# Patient Record
Sex: Male | Born: 1980 | Race: White | Hispanic: No | Marital: Single | Smoking: Current every day smoker
Health system: Southern US, Community
[De-identification: ages and names within clinical notes are randomized; demographics above are authoritative.]

## PROBLEM LIST (undated history)

## (undated) DIAGNOSIS — F1910 Other psychoactive substance abuse, uncomplicated: Secondary | ICD-10-CM

## (undated) DIAGNOSIS — B159 Hepatitis A without hepatic coma: Secondary | ICD-10-CM

## (undated) DIAGNOSIS — F419 Anxiety disorder, unspecified: Secondary | ICD-10-CM

## (undated) HISTORY — DX: Hepatitis a without hepatic coma: B15.9

## (undated) HISTORY — PX: WISDOM TOOTH EXTRACTION: SHX21

## (undated) HISTORY — DX: Other psychoactive substance abuse, uncomplicated: F19.10

## (undated) HISTORY — PX: VARICOCELECTOMY: SHX1084

---

## 2010-08-13 ENCOUNTER — Emergency Department (HOSPITAL_COMMUNITY)
Admission: EM | Admit: 2010-08-13 | Discharge: 2010-08-13 | Payer: Self-pay | Source: Home / Self Care | Admitting: Family Medicine

## 2010-08-19 LAB — GLUCOSE, CAPILLARY: Glucose-Capillary: 83 mg/dL (ref 70–99)

## 2017-09-30 ENCOUNTER — Emergency Department (HOSPITAL_BASED_OUTPATIENT_CLINIC_OR_DEPARTMENT_OTHER)
Admission: EM | Admit: 2017-09-30 | Discharge: 2017-09-30 | Disposition: A | Discharge status: Home / Self Care | Payer: No Typology Code available for payment source | Attending: Emergency Medicine | Admitting: Emergency Medicine

## 2017-09-30 ENCOUNTER — Encounter (HOSPITAL_BASED_OUTPATIENT_CLINIC_OR_DEPARTMENT_OTHER): Payer: Self-pay | Admitting: *Deleted

## 2017-09-30 ENCOUNTER — Other Ambulatory Visit: Payer: Self-pay

## 2017-09-30 DIAGNOSIS — F17210 Nicotine dependence, cigarettes, uncomplicated: Secondary | ICD-10-CM | POA: Insufficient documentation

## 2017-09-30 DIAGNOSIS — H9202 Otalgia, left ear: Secondary | ICD-10-CM | POA: Insufficient documentation

## 2017-09-30 HISTORY — DX: Anxiety disorder, unspecified: F41.9

## 2017-09-30 MED ORDER — AMOXICILLIN-POT CLAVULANATE 875-125 MG PO TABS: 1.0000 | ORAL_TABLET | Freq: Two times a day (BID) | ORAL | 0 refills | Status: DC

## 2017-09-30 MED ORDER — IBUPROFEN 800 MG PO TABS: 800.0000 mg | ORAL_TABLET | Freq: Three times a day (TID) | ORAL | 0 refills | Status: DC | PRN

## 2017-09-30 NOTE — ED Notes (Signed)
On the way out the door, pt stated "I ain't payin ya nothing.  Ya didn't do nothin'".

## 2017-09-30 NOTE — ED Provider Notes (Signed)
Emergency Department Provider Note   I have reviewed the triage vital signs and the nursing notes.   HISTORY  Chief Complaint Otalgia   HPI Patrick Lynn is a 37 y.o. male with PMH anxiety presents to the emergency department for evaluation of left ear pain with sensation of ear canal swelling.  Patient has been digging in the ear with various sharp objects at home and states he is been expressing some drainage.  He is having decreased hearing.  Symptoms have been present for the last 3 weeks.  He was on an outpatient antibiotic with no relief in symptoms.  Presented to urgent care today and was referred to the emergency department.  Denies fevers or shaking chills.  No headache or neck discomfort.  Denies vision changes.   Past Medical History:  Diagnosis Date  . Anxiety     There are no active problems to display for this patient.   History reviewed. No pertinent surgical history.  Current Outpatient Rx  . Order #: [id number] Class: Historical Med  . Order #: [id number] Class: Historical Med  . Order #: [id number] Class: Print  . Order #: [id number] Class: Print    Allergies Patient has no known allergies.  History reviewed. No pertinent family history.  Social History Social History   Tobacco Use  . Smoking status: Current Every Day Smoker    Types: Cigarettes  . Smokeless tobacco: Never Used  Substance Use Topics  . Alcohol use: No    Frequency: Never  . Drug use: No    Review of Systems  Constitutional: No fever/chills Eyes: No visual changes. ENT: No sore throat. Left ear pain and redness.  Cardiovascular: Denies chest pain. Respiratory: Denies shortness of breath. Gastrointestinal: No abdominal pain.  No nausea, no vomiting.  No diarrhea.  No constipation. Genitourinary: Negative for dysuria. Musculoskeletal: Negative for back pain. Skin: Negative for rash. Neurological: Negative for headaches, focal weakness or numbness.  10-point ROS otherwise  negative.  ____________________________________________   PHYSICAL EXAM:  VITAL SIGNS: ED Triage Vitals  Enc Vitals Group     BP 09/30/17 1745 (!) 132/92     Pulse Rate 09/30/17 1745 82     Resp 09/30/17 1745 12     Temp 09/30/17 1745 98.5 F (36.9 C)     Temp src --      SpO2 09/30/17 1745 100 %     Weight 09/30/17 1743 230 lb (104.3 kg)     Height 09/30/17 1743 6\' 4"  (1.93 m)     Pain Score 09/30/17 1743 5   Constitutional: Alert and oriented. Well appearing and in no acute distress. Eyes: Conjunctivae are normal.  Head: Atraumatic. Ears:  Mild narrowing of the left ear canal. Normal TMs bilaterally. Pinna on left is slightly erythematous. No discharge or abscess. No mastoid tenderness.  Nose: No congestion/rhinnorhea. Mouth/Throat: Mucous membranes are moist.  Neck: No stridor.   Cardiovascular: Good peripheral circulation.  Respiratory: Normal respiratory effort. Gastrointestinal: No distention.  Musculoskeletal: No lower extremity tenderness nor edema. No gross deformities of extremities. Neurologic:  Normal speech and language. No gross focal neurologic deficits are appreciated.  Skin:  Skin is warm, dry and intact. No rash noted.  ____________________________________________   PROCEDURES  Procedure(s) performed:   Procedures  None ____________________________________________   INITIAL IMPRESSION / ASSESSMENT AND PLAN / ED COURSE  Pertinent labs & imaging results that were available during my care of the patient were reviewed by me and considered in my medical decision making (see chart for  details).  Patient with mild erythema of the pinna.  The TM is visualized bilaterally with no obvious perforation.  He does have some mild edema in the left ear canal with no discharge/drainage.  There is only mild erythema in this area.  He has no mastoid tenderness.  Plan for additional antibiotic course and some pain medication.  I will provide contact information for  ENT encouraged him to call tomorrow morning to establish an appointment.  He tells me that he plans on continuing to cut in this area and poke himself in the ear to try and drain fluid.  I discouraged him from doing this in the strongest possible terms telling him that he can do permanent damage to his ear and make infection more likely/worse. Patient verbalizes understanding.   At this time, I do not feel there is any life-threatening condition present. I have reviewed and discussed all results (EKG, imaging, lab, urine as appropriate), exam findings with patient. I have reviewed nursing notes and appropriate previous records.  I feel the patient is safe to be discharged home without further emergent workup. Discussed usual and customary return precautions. Patient and family (if present) verbalize understanding and are comfortable with this plan.  Patient will follow-up with their primary care provider. If they do not have a primary care provider, information for follow-up has been provided to them. All questions have been answered.  ____________________________________________  FINAL CLINICAL IMPRESSION(S) / ED DIAGNOSES  Final diagnoses:  Left ear pain    NEW OUTPATIENT MEDICATIONS STARTED DURING THIS VISIT:  Discharge Medication List as of 09/30/2017  6:22 PM    START taking these medications   Details  amoxicillin-clavulanate (AUGMENTIN) 875-125 MG tablet Take 1 tablet by mouth every 12 (twelve) hours., Starting Wed 09/30/2017, Print    ibuprofen (ADVIL,MOTRIN) 800 MG tablet Take 1 tablet (800 mg total) by mouth every 8 (eight) hours as needed., Starting Wed 09/30/2017, Print        Note:  This document was prepared using Dragon voice recognition software and may include unintentional dictation errors.  Alona Bene, MD Emergency Medicine    Rumi Taras, Arlyss Repress, MD 10/01/17 [phone]

## 2017-09-30 NOTE — Discharge Instructions (Signed)
You were seen in the ED today with pain in the ear. We are starting antibiotics and will need for you to call the ENT first thing tomorrow morning. DO NOT place sharp objects in the ear or attempt to drain anything at home.   Return to the ED with any fever, chills, severe headache, or other symptoms.

## 2017-09-30 NOTE — ED Triage Notes (Signed)
Pt c/o left ear pain x 2 weeks

## 2018-03-11 ENCOUNTER — Emergency Department (HOSPITAL_COMMUNITY)
Admission: EM | Admit: 2018-03-11 | Discharge: 2018-03-11 | Discharge status: Against Medical Advice | Payer: No Typology Code available for payment source | Attending: Emergency Medicine | Admitting: Emergency Medicine

## 2018-03-11 DIAGNOSIS — I879 Disorder of vein, unspecified: Secondary | ICD-10-CM | POA: Diagnosis not present

## 2018-03-11 DIAGNOSIS — Z5321 Procedure and treatment not carried out due to patient leaving prior to being seen by health care provider: Secondary | ICD-10-CM | POA: Diagnosis not present

## 2018-03-11 NOTE — ED Triage Notes (Signed)
During his triage, patient decided to leave because he thought it would be a long wait and felt he needed to be seen immediately. Patient is alert, oriented x 4, and appears in no acute distress. No abscess noted to his forearm except a bruise to his left forearm.

## 2018-03-11 NOTE — ED Triage Notes (Signed)
Patient is a IV drug user, last used yesterday. However, he is concerned that his veins and arteries are collapsing. He is worried he is going to lose his arms from. Patient has strong radial pulse in both wrist with no color change.

## 2018-06-22 ENCOUNTER — Emergency Department (HOSPITAL_COMMUNITY)
Admission: EM | Admit: 2018-06-22 | Discharge: 2018-06-22 | Disposition: A | Discharge status: Home / Self Care | Payer: Self-pay | Attending: Emergency Medicine | Admitting: Emergency Medicine

## 2018-06-22 ENCOUNTER — Other Ambulatory Visit: Payer: Self-pay

## 2018-06-22 ENCOUNTER — Emergency Department (HOSPITAL_COMMUNITY): Payer: Self-pay

## 2018-06-22 ENCOUNTER — Encounter (HOSPITAL_COMMUNITY): Payer: Self-pay | Admitting: Emergency Medicine

## 2018-06-22 DIAGNOSIS — R1012 Left upper quadrant pain: Secondary | ICD-10-CM | POA: Insufficient documentation

## 2018-06-22 DIAGNOSIS — F17210 Nicotine dependence, cigarettes, uncomplicated: Secondary | ICD-10-CM | POA: Insufficient documentation

## 2018-06-22 DIAGNOSIS — R109 Unspecified abdominal pain: Secondary | ICD-10-CM

## 2018-06-22 DIAGNOSIS — Z79899 Other long term (current) drug therapy: Secondary | ICD-10-CM | POA: Insufficient documentation

## 2018-06-22 DIAGNOSIS — R079 Chest pain, unspecified: Secondary | ICD-10-CM | POA: Insufficient documentation

## 2018-06-22 LAB — CBC
HCT: 41.8 % (ref 39.0–52.0)
Hemoglobin: 13.5 g/dL (ref 13.0–17.0)
MCH: 30.6 pg (ref 26.0–34.0)
MCHC: 32.3 g/dL (ref 30.0–36.0)
MCV: 94.8 fL (ref 80.0–100.0)
Platelets: 306 10*3/uL (ref 150–400)
RBC: 4.41 MIL/uL (ref 4.22–5.81)
RDW: 12.6 % (ref 11.5–15.5)
WBC: 6 10*3/uL (ref 4.0–10.5)
nRBC: 0 % (ref 0.0–0.2)

## 2018-06-22 LAB — COMPREHENSIVE METABOLIC PANEL
ALT: 33 U/L (ref 0–44)
AST: 41 U/L (ref 15–41)
Albumin: 4 g/dL (ref 3.5–5.0)
Alkaline Phosphatase: 60 U/L (ref 38–126)
Anion gap: 9 (ref 5–15)
BUN: 13 mg/dL (ref 6–20)
CO2: 26 mmol/L (ref 22–32)
Calcium: 9.1 mg/dL (ref 8.9–10.3)
Chloride: 102 mmol/L (ref 98–111)
Creatinine, Ser: 0.81 mg/dL (ref 0.61–1.24)
GFR calc Af Amer: >60 mL/min (ref >60)
GFR calc non Af Amer: >60 mL/min (ref >60)
Glucose, Bld: 112 mg/dL — ABNORMAL HIGH (ref 70–99)
Potassium: 3.9 mmol/L (ref 3.5–5.1)
Sodium: 137 mmol/L (ref 135–145)
Total Bilirubin: 0.7 mg/dL (ref 0.3–1.2)
Total Protein: 7.2 g/dL (ref 6.5–8.1)

## 2018-06-22 LAB — LIPASE, BLOOD: Lipase: 35 U/L (ref 11–51)

## 2018-06-22 MED ORDER — FAMOTIDINE 20 MG PO TABS
20.0000 mg | ORAL_TABLET | Freq: Once | ORAL | Status: AC
  Administered 2018-06-22: 20 mg via ORAL
  Filled 2018-06-22: qty 1

## 2018-06-22 MED ORDER — ACETAMINOPHEN 500 MG PO TABS
1000.0000 mg | ORAL_TABLET | Freq: Once | ORAL | Status: AC
  Administered 2018-06-22: 1000 mg via ORAL
  Filled 2018-06-22: qty 2

## 2018-06-22 MED ORDER — ALUM & MAG HYDROXIDE-SIMETH 200-200-20 MG/5ML PO SUSP
15.0000 mL | Freq: Once | ORAL | Status: AC
  Administered 2018-06-22: 15 mL via ORAL
  Filled 2018-06-22: qty 30

## 2018-06-22 MED ORDER — SODIUM CHLORIDE (PF) 0.9 % IJ SOLN
INTRAMUSCULAR | Status: AC
  Filled 2018-06-22: qty 50

## 2018-06-22 MED ORDER — IOPAMIDOL (ISOVUE-300) INJECTION 61%
100.0000 mL | Freq: Once | INTRAVENOUS | Status: AC | PRN
  Administered 2018-06-22: 100 mL via INTRAVENOUS

## 2018-06-22 MED ORDER — IOPAMIDOL (ISOVUE-300) INJECTION 61%
INTRAVENOUS | Status: AC
  Filled 2018-06-22: qty 100

## 2018-06-22 NOTE — ED Notes (Signed)
Whereas before he was pacing as if very uncomfortable, he is now lying quietly in bed. He is easily aroused for v.s.

## 2018-06-22 NOTE — ED Triage Notes (Signed)
Pt reporting upper abdominal pain that started last night. Pt was seen on today's date at [facility].

## 2018-06-22 NOTE — ED Provider Notes (Signed)
[facility]-EMERGENCY DEPT Provider Note   CSN: [id number] Arrival date & time: 06/22/18  4098     History   Chief Complaint Chief Complaint  Patient presents with  . Abdominal Pain    HPI Patrick Lynn is a 37 y.o. male with past medical history of anxiety, polysubstance abuse, presenting to the emergency department with complaint of left upper abdominal pain x2 days.  He was recently evaluated at [facility] ED earlier this morning with complaining of chest pain and rash.  He had negative cardiac work-up, including negative troponin I x2, negative chest x-ray, negative BMP and normal labs.  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  He presents to this ED with complaint of migrating left chest pain to his left upper abdomen.  It is sharp, constant, and worse with movement.  He reports associated nausea and vomiting.  On this evaluation, he states he last used cocaine 2 days ago, denies use of methamphetamine.  Endorses prior use of heroin.  No medications tried for pain.  Denies diarrhea, constipation, urinary symptoms, cough.  The history is provided by the patient and medical records.    Past Medical History:  Diagnosis Date  . Anxiety     There are no active problems to display for this patient.   History reviewed. No pertinent surgical history.      Home Medications    Prior to Admission medications   Medication Sig Start Date End Date Taking? Authorizing Provider  acyclovir (ZOVIRAX) 400 MG tablet Take 400 mg by mouth 5 (five) times daily as needed (fever blisters).    Yes [provider]  ALPRAZolam Prudy Feeler) 0.5 MG tablet Take 0.5 mg by mouth at bedtime as needed for sleep.    Yes [provider]  amoxicillin-clavulanate (AUGMENTIN) 875-125 MG tablet Take 1 tablet by mouth every 12 (twelve) hours. Patient not taking:  Reported on 06/22/2018 09/30/17   Long, Arlyss Repress, MD  ibuprofen (ADVIL,MOTRIN) 800 MG tablet Take 1 tablet (800 mg total) by mouth every 8 (eight) hours as needed. Patient not taking: Reported on 06/22/2018 09/30/17   Long, Arlyss Repress, MD    Family History History reviewed. No pertinent family history.  Social History Social History   Tobacco Use  . Smoking status: Current Every Day Smoker    Types: Cigarettes  . Smokeless tobacco: Never Used  Substance Use Topics  . Alcohol use: No    Frequency: Never  . Drug use: No     Allergies   Bee venom   Review of Systems Review of Systems  Constitutional: Negative for fever.  Gastrointestinal: Positive for abdominal pain, nausea and vomiting. Negative for constipation and diarrhea.  Genitourinary: Negative for dysuria and frequency.  All other systems reviewed and are negative.    Physical Exam Updated Vital Signs BP 123/70 (BP Location: Left Arm)   Pulse 87   Temp 98 F (36.7 C)   Resp 18   Ht 6\' 4"  (1.93 m)   Wt 95.3 kg   SpO2 99%   BMI 25.56 kg/m   Physical Exam  Constitutional: He appears well-developed and well-nourished.  Patient curled over, grasping left upper abdomen.  HENT:  Head: Normocephalic and atraumatic.  Eyes: Conjunctivae are normal.  Cardiovascular: Regular rhythm.  Slightly tachycardic  Pulmonary/Chest: Effort normal and breath sounds normal. No respiratory distress.  Abdominal: Soft. Bowel sounds are normal. He exhibits no distension.  There is tenderness in the epigastric area, periumbilical area, left upper quadrant and left lower quadrant. There is guarding.  Neurological: He is alert.  Skin: Skin is warm.  Multiple scabbed lesions diffusely.  There are track marks to bilateral forearms.  No obvious purulent drainage or significant surrounding erythema.  Psychiatric: He has a normal mood and affect. His behavior is normal.  Nursing note and vitals reviewed.    ED Treatments / Results   Labs (all labs ordered are listed, but only abnormal results are displayed) Labs Reviewed  COMPREHENSIVE METABOLIC PANEL - Abnormal; Notable for the following components:      Result Value   Glucose, Bld 112 (*)    All other components within normal limits  LIPASE, BLOOD  CBC    EKG EKG Interpretation  Date/Time:  Tuesday June 22 2018 06:34:13 EST Ventricular Rate:  94 PR Interval:    QRS Duration: 98 QT Interval:  364 QTC Calculation: 456 R Axis:   93 Text Interpretation:  Sinus rhythm Borderline right axis deviation No previous tracing Confirmed by Cathren LaineSteinl, Kevin ([id number]) on 06/22/2018 8:15:31 AM   Radiology Ct Abdomen Pelvis W Contrast  Result Date: 06/22/2018 CLINICAL DATA:  Acute upper abdominal pain. EXAM: CT ABDOMEN AND PELVIS WITH CONTRAST TECHNIQUE: Multidetector CT imaging of the abdomen and pelvis was performed using the standard protocol following bolus administration of intravenous contrast. CONTRAST:  100mL ISOVUE-300 IOPAMIDOL (ISOVUE-300) INJECTION 61% COMPARISON:  None. FINDINGS: Lower chest: No acute abnormality. Hepatobiliary: No focal liver abnormality is seen. No gallstones, gallbladder wall thickening, or biliary dilatation. Pancreas: Unremarkable. No pancreatic ductal dilatation or surrounding inflammatory changes. Spleen: Normal in size without focal abnormality. Adrenals/Urinary Tract: Adrenal glands are unremarkable. Kidneys are normal, without renal calculi, focal lesion, or hydronephrosis. Bladder is unremarkable. Stomach/Bowel: Stomach is within normal limits. Appendix appears normal. No evidence of bowel wall thickening, distention, or inflammatory changes. Vascular/Lymphatic: No significant vascular findings are present. No enlarged abdominal or pelvic lymph nodes. Reproductive: Prostate is unremarkable. Other: No abdominal wall hernia or abnormality. No abdominopelvic ascites. Musculoskeletal: No acute or significant osseous findings. IMPRESSION: No  acute abnormality seen in the abdomen or pelvis. Electronically Signed   By: Lupita RaiderJames  Green Jr, M.D.   On: 06/22/2018 10:36    Procedures Procedures (including critical care time)  Medications Ordered in ED Medications  famotidine (PEPCID) tablet 20 mg (20 mg Oral Given 06/22/18 0840)  alum & mag hydroxide-simeth (MAALOX/MYLANTA) 200-200-20 MG/5ML suspension 15 mL (15 mLs Oral Given 06/22/18 0840)  acetaminophen (TYLENOL) tablet 1,000 mg (1,000 mg Oral Given 06/22/18 0840)  iopamidol (ISOVUE-300) 61 % injection 100 mL (100 mLs Intravenous Contrast Given 06/22/18 1015)     Initial Impression / Assessment and Plan / ED Course  I have reviewed the triage vital signs and the nursing notes.  Pertinent labs & imaging results that were available during my care of the patient were reviewed by me and considered in my medical decision making (see chart for details).     Patient presenting to the emergency department after being seen at [facility] just prior to arrival, complaining of left-sided abdominal pain.  He had negative cardiac work-up during that visit and was prescribed permethrin for skin.  Reportedly abuses amphetamines and cocaine.  On exam, patient appears to be in pain.  Abdomen is soft with some guarding of the left abdomen.  He is afebrile.  White count is normal.  Electrolytes are reassuring.  Lipase within normal limits.  Dr. Denton LankSteinl  evaluated this patient.  Will CT scan to rule out any acute intra-abdominal pathology.  CT scan is negative.  On reevaluation after interventions, patient is sleeping without complaints.  Discussed reassuring results.  Recommend symptomatic management and establish primary care.  Patient is agreeable to plan and safe for discharge.  Discussed results, findings, treatment and follow up. Patient advised of return precautions. Patient verbalized understanding and agreed with plan.  Final Clinical Impressions(s) / ED Diagnoses   Final diagnoses:   Left sided abdominal pain    ED Discharge Orders    None       Mahki Spikes, [place] N, PA-C 06/22/18 1444    Cathren Laine, MD 06/28/18 1557

## 2018-06-22 NOTE — Discharge Instructions (Signed)
Your CT scan is normal. Your labs are reassuring. You can take tylenol or ibuprofen every 6 hours for pain.  Take the medications as prescribed by the previous provider at [facility]. Establish primary care.

## 2018-06-30 ENCOUNTER — Emergency Department (HOSPITAL_COMMUNITY): Payer: Self-pay

## 2018-06-30 ENCOUNTER — Encounter (HOSPITAL_COMMUNITY): Payer: Self-pay | Admitting: Family Medicine

## 2018-06-30 ENCOUNTER — Emergency Department (HOSPITAL_COMMUNITY)
Admission: EM | Admit: 2018-06-30 | Discharge: 2018-06-30 | Disposition: A | Discharge status: Home / Self Care | Payer: Self-pay | Attending: Emergency Medicine | Admitting: Emergency Medicine

## 2018-06-30 DIAGNOSIS — F1910 Other psychoactive substance abuse, uncomplicated: Secondary | ICD-10-CM | POA: Insufficient documentation

## 2018-06-30 DIAGNOSIS — M546 Pain in thoracic spine: Secondary | ICD-10-CM | POA: Insufficient documentation

## 2018-06-30 DIAGNOSIS — R0789 Other chest pain: Secondary | ICD-10-CM | POA: Insufficient documentation

## 2018-06-30 DIAGNOSIS — F17210 Nicotine dependence, cigarettes, uncomplicated: Secondary | ICD-10-CM | POA: Insufficient documentation

## 2018-06-30 DIAGNOSIS — F1490 Cocaine use, unspecified, uncomplicated: Secondary | ICD-10-CM | POA: Insufficient documentation

## 2018-06-30 DIAGNOSIS — R1010 Upper abdominal pain, unspecified: Secondary | ICD-10-CM | POA: Insufficient documentation

## 2018-06-30 LAB — CBC WITH DIFFERENTIAL/PLATELET
Abs Immature Granulocytes: 0.02 10*3/uL (ref 0.00–0.07)
Basophils Absolute: 0 10*3/uL (ref 0.0–0.1)
Basophils Relative: 1 %
Eosinophils Absolute: 0.1 10*3/uL (ref 0.0–0.5)
Eosinophils Relative: 1 %
HCT: 44.3 % (ref 39.0–52.0)
Hemoglobin: 14.1 g/dL (ref 13.0–17.0)
Immature Granulocytes: 0 %
Lymphocytes Relative: 41 %
Lymphs Abs: 2.4 10*3/uL (ref 0.7–4.0)
MCH: 30.6 pg (ref 26.0–34.0)
MCHC: 31.8 g/dL (ref 30.0–36.0)
MCV: 96.1 fL (ref 80.0–100.0)
Monocytes Absolute: 0.6 10*3/uL (ref 0.1–1.0)
Monocytes Relative: 10 %
Neutro Abs: 2.8 10*3/uL (ref 1.7–7.7)
Neutrophils Relative %: 47 %
Platelets: 315 10*3/uL (ref 150–400)
RBC: 4.61 MIL/uL (ref 4.22–5.81)
RDW: 12.6 % (ref 11.5–15.5)
WBC: 6 10*3/uL (ref 4.0–10.5)
nRBC: 0 % (ref 0.0–0.2)

## 2018-06-30 LAB — COMPREHENSIVE METABOLIC PANEL
ALT: 24 U/L (ref 0–44)
AST: 22 U/L (ref 15–41)
Albumin: 4.4 g/dL (ref 3.5–5.0)
Alkaline Phosphatase: 68 U/L (ref 38–126)
Anion gap: 8 (ref 5–15)
BUN: 19 mg/dL (ref 6–20)
CO2: 31 mmol/L (ref 22–32)
Calcium: 9.4 mg/dL (ref 8.9–10.3)
Chloride: 102 mmol/L (ref 98–111)
Creatinine, Ser: 1.08 mg/dL (ref 0.61–1.24)
GFR calc Af Amer: >60 mL/min (ref >60)
GFR calc non Af Amer: >60 mL/min (ref >60)
Glucose, Bld: 73 mg/dL (ref 70–99)
Potassium: 3.8 mmol/L (ref 3.5–5.1)
Sodium: 141 mmol/L (ref 135–145)
Total Bilirubin: 0.6 mg/dL (ref 0.3–1.2)
Total Protein: 8 g/dL (ref 6.5–8.1)

## 2018-06-30 LAB — LIPASE, BLOOD: Lipase: 37 U/L (ref 11–51)

## 2018-06-30 LAB — URINALYSIS, ROUTINE W REFLEX MICROSCOPIC
Bilirubin Urine: NEGATIVE
Glucose, UA: NEGATIVE mg/dL
Hgb urine dipstick: NEGATIVE
Ketones, ur: NEGATIVE mg/dL
Leukocytes, UA: NEGATIVE
Nitrite: NEGATIVE
Protein, ur: NEGATIVE mg/dL
Specific Gravity, Urine: 1.009 (ref 1.005–1.030)
pH: 6 (ref 5.0–8.0)

## 2018-06-30 LAB — RAPID URINE DRUG SCREEN, HOSP PERFORMED
Amphetamines: NOT DETECTED
Barbiturates: NOT DETECTED
Benzodiazepines: NOT DETECTED
Cocaine: POSITIVE — AB
Opiates: NOT DETECTED
Tetrahydrocannabinol: NOT DETECTED

## 2018-06-30 LAB — I-STAT TROPONIN, ED: Troponin i, poc: 0 ng/mL (ref 0.00–0.08)

## 2018-06-30 LAB — I-STAT CG4 LACTIC ACID, ED: Lactic Acid, Venous: 0.57 mmol/L (ref 0.5–1.9)

## 2018-06-30 MED ORDER — GADOBUTROL 1 MMOL/ML IV SOLN: 10.0000 mL | Freq: Once | INTRAVENOUS | Status: DC | PRN

## 2018-06-30 MED ORDER — SODIUM CHLORIDE 0.9 % IV BOLUS
1000.0000 mL | Freq: Once | INTRAVENOUS | Status: AC
  Administered 2018-06-30: 1000 mL via INTRAVENOUS

## 2018-06-30 NOTE — ED Notes (Signed)
While assessing pt , he states that he has parasites in his arms and he can feel them moving, but no one believes him. Pt states he has an appointment with the gastroenterologist later today

## 2018-06-30 NOTE — ED Triage Notes (Signed)
Patient is complaining of back that radiates around to his abd. Patient reports symptoms started tonight after applying a prescribed cream. Patient reports this pain is similar to when we was seen earlier this [facility] at [facility].

## 2018-06-30 NOTE — ED Notes (Signed)
Patient transported to MRI by MRI tech. 

## 2018-06-30 NOTE — ED Notes (Signed)
Patient transported to MRI 

## 2018-06-30 NOTE — ED Provider Notes (Signed)
[facility]-EMERGENCY DEPT Provider Note   CSN: [id number] Arrival date & time: 06/30/18  0448     History   Chief Complaint Chief Complaint  Patient presents with  . Back Pain    HPI Patrick Lynn is a 37 y.o. male with a hx of IV drug use, polysubstance abuse presents to the Emergency Department complaining of acute, persistent, progressively worsening mid and upper midline back pain that radiates around through his chest and into his upper abdomen onset several hours prior to arrival.  He reports the pain is a 10/10.  He is unable to describe the pain.  No treatments prior to arrival.  Nothing makes it better or worse.  Patient reports associated subjective fevers at home.  He denies falls or known injuries.  Patient admits to using cocaine and injecting heroin and fentanyl.  His last injection was just prior to arrival.  Patient denies methamphetamine usage however chart documents to be usage.  Record review shows that patient was evaluated on 06/22/2018 here in the emergency department for left upper abdominal pain and chest pain.  He was seen that same morning at [facility] with a negative cardiac work-up including negative troponin x2, negative chest x-ray, negative BNP and normal labs.  That afternoon, he had a nonischemic EKG and a normal CT abdomen.  Patient has only 2 previous visits here in the [facility] system.  Numerous visits to the [facility] emergency department over the last year.   The history is provided by the patient and medical records. No language interpreter was used.    Past Medical History:  Diagnosis Date  . Anxiety     There are no active problems to display for this patient.   History reviewed. No pertinent surgical history.      Home Medications    Prior to Admission medications   Medication Sig Start Date End Date Taking? Authorizing Provider  acyclovir (ZOVIRAX) 400 MG tablet Take 400 mg by mouth 5  (five) times daily as needed (fever blisters).     [provider]  ALPRAZolam Prudy Feeler(XANAX) 0.5 MG tablet Take 0.5 mg by mouth at bedtime as needed for sleep.     [provider]  amoxicillin-clavulanate (AUGMENTIN) 875-125 MG tablet Take 1 tablet by mouth every 12 (twelve) hours. Patient not taking: Reported on 06/22/2018 09/30/17   Long, Arlyss RepressJoshua G, MD  ibuprofen (ADVIL,MOTRIN) 800 MG tablet Take 1 tablet (800 mg total) by mouth every 8 (eight) hours as needed. Patient not taking: Reported on 06/22/2018 09/30/17   Long, Arlyss RepressJoshua G, MD    Family History History reviewed. No pertinent family history.  Social History Social History   Tobacco Use  . Smoking status: Current Every Day Smoker    Types: Cigarettes  . Smokeless tobacco: Never Used  Substance Use Topics  . Alcohol use: No    Frequency: Never  . Drug use: No     Allergies   Bee venom   Review of Systems Review of Systems  Constitutional: Negative for appetite change, diaphoresis, fatigue, fever and unexpected weight change.  HENT: Negative for mouth sores.   Eyes: Negative for visual disturbance.  Respiratory: Negative for cough, chest tightness, shortness of breath and wheezing.   Cardiovascular: Positive for chest pain.  Gastrointestinal: Positive for abdominal pain. Negative for constipation, diarrhea, nausea and vomiting.  Endocrine: Negative for polydipsia, polyphagia and polyuria.  Genitourinary: Negative for dysuria, frequency, hematuria and urgency.  Musculoskeletal: Positive for back  pain. Negative for neck stiffness.  Skin: Negative for rash.  Allergic/Immunologic: Negative for immunocompromised state.  Neurological: Negative for syncope, light-headedness and headaches.  Hematological: Does not bruise/bleed easily.  Psychiatric/Behavioral: Negative for sleep disturbance. The patient is not nervous/anxious.      Physical Exam Updated Vital Signs BP (!) 110/52 (BP Location: Left Arm)   Pulse  91   Temp 97.9 F (36.6 C) (Oral)   Resp 18   Ht 6\' 4"  (1.93 m)   Wt 95.3 kg   SpO2 100%   BMI 25.56 kg/m   Physical Exam  Constitutional: He appears well-developed and well-nourished. No distress.  Awake, alert, nontoxic appearance  HENT:  Head: Normocephalic and atraumatic.  Mouth/Throat: Oropharynx is clear and moist.  Numerous scabs and open wounds on the face  Eyes: Conjunctivae are normal. No scleral icterus.  Neck: Normal range of motion. Neck supple.  Full ROM without pain No nuchal rigidity.  Cardiovascular: Regular rhythm. Tachycardia present.  Pulses:      Radial pulses are 2+ on the right side, and 2+ on the left side.       Dorsalis pedis pulses are 2+ on the right side, and 2+ on the left side.  Pulmonary/Chest: Effort normal and breath sounds normal. No respiratory distress. He has no wheezes.  Equal chest expansion Clear and equal breath sounds  Abdominal: Soft. Bowel sounds are normal. He exhibits no distension and no mass. There is generalized tenderness. There is guarding. There is no rigidity, no rebound and no CVA tenderness.  Generally tender throughout with voluntary guarding  Musculoskeletal: Normal range of motion. He exhibits no edema.  Full range of motion of the T-spine and L-spine Mild midline tenderness to the  T-spine and L-spine; no step-off or deformity. Tenderness to palpation of the paraspinous muscles of the T-spine wrapping all the way around his ribs to the front of his chest. Moves all extremities without difficulty.  No ataxia  Lymphadenopathy:    He has no cervical adenopathy.  Neurological: He is alert.  Speech is clear and goal oriented Moves extremities without ataxia  Skin: Skin is warm and dry. No rash noted. He is not diaphoretic. No erythema.  Patient's bilateral upper extremities are covered with track marks and sites of picking  Psychiatric: He has a normal mood and affect. His behavior is normal.  Nursing note and vitals  reviewed.    ED Treatments / Results  Labs (all labs ordered are listed, but only abnormal results are displayed) Labs Reviewed  CBC WITH DIFFERENTIAL/PLATELET  COMPREHENSIVE METABOLIC PANEL  URINALYSIS, ROUTINE W REFLEX MICROSCOPIC  RAPID URINE DRUG SCREEN, HOSP PERFORMED  LIPASE, BLOOD  I-STAT CG4 LACTIC ACID, ED  I-STAT TROPONIN, ED    EKG None  Radiology No results found.  Procedures Procedures (including critical care time)  Medications Ordered in ED Medications  sodium chloride 0.9 % bolus 1,000 mL (has no administration in time range)     Initial Impression / Assessment and Plan / ED Course  I have reviewed the triage vital signs and the nursing notes.  Pertinent labs & imaging results that were available during my care of the patient were reviewed by me and considered in my medical decision making (see chart for details).     Patient presents to the emergency department with planes of severe back pain radiating into his chest and upper abdomen.  He recently had cardiac work-up and CT scan of his abdomen without acute abnormality.  Patient has  numerous sites of IV drug injection.  He denies falls or known injuries.  He denies previous fractures of his back.  He does report subjective fevers at home.  Concern for possible osteomyelitis or epidural abscess.  Lab work and imaging pending.  At shift change care was transferred to [facility], PA-C who will follow pending studies, re-evaulate and determine disposition.     Final Clinical Impressions(s) / ED Diagnoses   Final diagnoses:  Acute midline thoracic back pain    ED Discharge Orders    None       Mardene Sayer Boyd Kerbs 06/30/18 0617    Zadie Rhine, MD 06/30/18 [phone]

## 2018-06-30 NOTE — Discharge Instructions (Addendum)
Return here as needed. Follow up with your doctor or an urgent care. Your testing here today was normal.

## 2018-08-07 ENCOUNTER — Emergency Department (HOSPITAL_COMMUNITY)
Admission: EM | Admit: 2018-08-07 | Discharge: 2018-08-07 | Disposition: A | Discharge status: Home / Self Care | Payer: Self-pay

## 2018-08-07 NOTE — ED Notes (Signed)
Bed: WTR7 Expected date:  Expected time:  Means of arrival:  Comments: 

## 2019-04-05 ENCOUNTER — Emergency Department (HOSPITAL_COMMUNITY)
Admission: EM | Admit: 2019-04-05 | Discharge: 2019-04-06 | Disposition: A | Discharge status: Home / Self Care | Payer: Self-pay | Attending: Emergency Medicine | Admitting: Emergency Medicine

## 2019-04-05 ENCOUNTER — Other Ambulatory Visit: Payer: Self-pay

## 2019-04-05 ENCOUNTER — Encounter (HOSPITAL_COMMUNITY): Payer: Self-pay

## 2019-04-05 DIAGNOSIS — I809 Phlebitis and thrombophlebitis of unspecified site: Secondary | ICD-10-CM

## 2019-04-05 DIAGNOSIS — L03113 Cellulitis of right upper limb: Secondary | ICD-10-CM

## 2019-04-05 DIAGNOSIS — F17210 Nicotine dependence, cigarettes, uncomplicated: Secondary | ICD-10-CM | POA: Insufficient documentation

## 2019-04-05 NOTE — ED Triage Notes (Signed)
For 2 days pain in right elbow area with possible abscess and redness around area hot to touch.

## 2019-04-05 NOTE — ED Provider Notes (Signed)
[facility]-EMERGENCY DEPT Provider Note   CSN: [id number] Arrival date & time: 04/05/19  2050     [id number]   Chief Complaint Chief Complaint  Patient presents with  . infection in right elbow    HPI Patrick CroakJeffrey Lynn is a 38 y.o. male.     Patient presents with swelling, tenderness, and redness in the antecubital fossa of the right arm, onset two days ago. 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 He states that the swelling worsens during the night and seems to improve during the day. He endorses mild malaise and occasional chills.  The history is provided by the patient. No language interpreter was used.  Abscess Location:  Shoulder/arm Shoulder/arm abscess location:  R elbow Abscess quality: induration, painful and warmth   Red streaking: yes   Duration:  2 days Progression:  Worsening Pain details:    Quality:  Pressure and throbbing   Severity:  Moderate   Duration:  2 days   Timing:  Constant   Progression:  Waxing and waning Chronicity:  New   Past Medical History:  Diagnosis Date  . Anxiety     There are no active problems to display for this patient.   History reviewed. No pertinent surgical history.      Home Medications    Prior to Admission medications   Medication Sig Start Date End Date Taking? Authorizing Provider  amoxicillin-clavulanate (AUGMENTIN) 875-125 MG tablet Take 1 tablet by mouth every 12 (twelve) hours. Patient not taking: Reported on 06/22/2018 09/30/17   Long, Arlyss RepressJoshua G, MD  doxycycline (VIBRAMYCIN) 100 MG capsule Take 100 mg by mouth 2 (two) times daily. 06/22/18   [provider]  ibuprofen (ADVIL,MOTRIN) 200 MG tablet Take 600 mg by mouth every 6 (six) hours as needed for mild pain.    [provider]  ibuprofen (ADVIL,MOTRIN) 800 MG tablet Take 1 tablet (800 mg total) by mouth every 8 (eight) hours as needed. Patient not taking: Reported on 06/22/2018 09/30/17   Long, Arlyss RepressJoshua  G, MD  permethrin (ELIMITE) 5 % cream Apply 1 application topically See admin instructions. Apply to affected area daily. Leave on for 8 hours before showering 06/22/18   [provider]    Family History History reviewed. No pertinent family history.  Social History Social History   Tobacco Use  . Smoking status: Current Every Day Smoker    Types: Cigarettes  . Smokeless tobacco: Never Used  Substance Use Topics  . Alcohol use: No    Frequency: Never  . Drug use: No     Allergies   Bee venom   Review of Systems Review of Systems  All other systems reviewed and are negative.    Physical Exam Updated Vital Signs BP 118/82 (BP Location: Left Arm)   Pulse 87   Temp 98.2 F (36.8 C) (Oral)   Resp 16   Ht 6\' 4"  (1.93 m)   Wt 102.1 kg   SpO2 98%   BMI 27.39 kg/m   Physical Exam Vitals signs and nursing note reviewed.  Constitutional:      Appearance: Normal appearance.  HENT:     Head: Normocephalic.  Eyes:     Conjunctiva/sclera: Conjunctivae normal.  Cardiovascular:     Rate and Rhythm: Normal rate and regular rhythm.  Pulmonary:     Effort: Pulmonary effort is normal.     Breath sounds: Normal breath sounds.  Abdominal:     Palpations: Abdomen is soft.  Musculoskeletal:        General: Swelling and tenderness present.  Skin:    General: Skin is warm and dry.  Neurological:     Mental Status: He is alert and oriented to person, place, and time.  Psychiatric:        Mood and Affect: Mood normal.          ED Treatments / Results  Labs (all labs ordered are listed, but only abnormal results are displayed) Labs Reviewed - No data to display  EKG None  Radiology No results found.  Procedures Procedures (including critical care time)  Medications Ordered in ED Medications - No data to display   Initial Impression / Assessment and Plan / ED Course  I have reviewed the triage vital signs and the nursing notes.  Pertinent  labs & imaging results that were available during my care of the patient were reviewed by me and considered in my medical decision making (see chart for details).       Bedside ultrasound performed, no discrete abscess noted.    Patient discussed with and seen by Dr. Roxanne Mins. Patient will be given dose of xarelto in ED with vascular ultrasound scheduled for Wednesday morning. Cellulitis treatment begun with doxycycline.  Patient presentation consistent with cellulitis. Afebrile. No tachycardia, hypotension or other symptoms suggestive of severe infection.   Will discharge with doxycycline. Return precautions discussed. Pt appears safe for discharge.   Final Clinical Impressions(s) / ED Diagnoses   Final diagnoses:  Phlebitis  Cellulitis of right upper extremity    ED Discharge Orders         Ordered    UE VENOUS DUPLEX     04/06/19 0034    doxycycline (VIBRAMYCIN) 100 MG capsule  2 times daily     04/06/19 0034           Etta Quill, NP 20/10/07 1219    Delora Fuel, MD 75/88/32 [phone]

## 2019-04-06 ENCOUNTER — Ambulatory Visit (HOSPITAL_COMMUNITY)
Admission: RE | Admit: 2019-04-06 | Discharge: 2019-04-06 | Disposition: A | Discharge status: Home / Self Care | Payer: Self-pay | Source: Ambulatory Visit | Attending: Vascular Surgery | Admitting: Vascular Surgery

## 2019-04-06 DIAGNOSIS — M7989 Other specified soft tissue disorders: Secondary | ICD-10-CM | POA: Insufficient documentation

## 2019-04-06 DIAGNOSIS — M79601 Pain in right arm: Secondary | ICD-10-CM | POA: Insufficient documentation

## 2019-04-06 DIAGNOSIS — R52 Pain, unspecified: Secondary | ICD-10-CM

## 2019-04-06 MED ORDER — DOXYCYCLINE HYCLATE 100 MG PO CAPS: 100.0000 mg | ORAL_CAPSULE | Freq: Two times a day (BID) | ORAL | 0 refills | Status: DC

## 2019-04-06 MED ORDER — RIVAROXABAN 15 MG PO TABS
15.0000 mg | ORAL_TABLET | Freq: Once | ORAL | Status: AC
  Administered 2019-04-06: 15 mg via ORAL
  Filled 2019-04-06: qty 1

## 2019-04-06 NOTE — ED Notes (Signed)
Patient stable and ambulatory at time of discharge. Instructions and medications reviewed with patient. Opportunity for questions and concerns allowed with none voiced.

## 2019-04-06 NOTE — Discharge Instructions (Signed)
Please report to [facility] in the morning (by 11 am) for your vascular study. Refer to attached instructions for cellulitis and phlebitis.

## 2019-07-06 ENCOUNTER — Emergency Department (HOSPITAL_BASED_OUTPATIENT_CLINIC_OR_DEPARTMENT_OTHER): Payer: Self-pay

## 2019-07-06 ENCOUNTER — Encounter (HOSPITAL_BASED_OUTPATIENT_CLINIC_OR_DEPARTMENT_OTHER): Payer: Self-pay

## 2019-07-06 ENCOUNTER — Inpatient Hospital Stay (HOSPITAL_BASED_OUTPATIENT_CLINIC_OR_DEPARTMENT_OTHER)
Admission: EM | Admit: 2019-07-06 | Discharge: 2019-07-09 | DRG: 442 | Disposition: A | Discharge status: Home / Self Care | Payer: Self-pay | Attending: Internal Medicine | Admitting: Internal Medicine

## 2019-07-06 ENCOUNTER — Other Ambulatory Visit: Payer: Self-pay

## 2019-07-06 DIAGNOSIS — F17210 Nicotine dependence, cigarettes, uncomplicated: Secondary | ICD-10-CM | POA: Diagnosis present

## 2019-07-06 DIAGNOSIS — R768 Other specified abnormal immunological findings in serum: Secondary | ICD-10-CM | POA: Diagnosis present

## 2019-07-06 DIAGNOSIS — R7989 Other specified abnormal findings of blood chemistry: Secondary | ICD-10-CM | POA: Diagnosis present

## 2019-07-06 DIAGNOSIS — E871 Hypo-osmolality and hyponatremia: Secondary | ICD-10-CM | POA: Diagnosis present

## 2019-07-06 DIAGNOSIS — R112 Nausea with vomiting, unspecified: Secondary | ICD-10-CM

## 2019-07-06 DIAGNOSIS — F1910 Other psychoactive substance abuse, uncomplicated: Secondary | ICD-10-CM | POA: Diagnosis present

## 2019-07-06 DIAGNOSIS — B179 Acute viral hepatitis, unspecified: Secondary | ICD-10-CM

## 2019-07-06 DIAGNOSIS — F419 Anxiety disorder, unspecified: Secondary | ICD-10-CM | POA: Diagnosis present

## 2019-07-06 DIAGNOSIS — R17 Unspecified jaundice: Secondary | ICD-10-CM

## 2019-07-06 DIAGNOSIS — Z20828 Contact with and (suspected) exposure to other viral communicable diseases: Secondary | ICD-10-CM | POA: Diagnosis present

## 2019-07-06 DIAGNOSIS — B159 Hepatitis A without hepatic coma: Principal | ICD-10-CM | POA: Diagnosis present

## 2019-07-06 DIAGNOSIS — R945 Abnormal results of liver function studies: Secondary | ICD-10-CM | POA: Diagnosis present

## 2019-07-06 DIAGNOSIS — K760 Fatty (change of) liver, not elsewhere classified: Secondary | ICD-10-CM | POA: Diagnosis present

## 2019-07-06 DIAGNOSIS — Z91030 Bee allergy status: Secondary | ICD-10-CM

## 2019-07-06 HISTORY — DX: Abnormal results of liver function studies: R94.5

## 2019-07-06 LAB — RAPID URINE DRUG SCREEN, HOSP PERFORMED
Amphetamines: NOT DETECTED
Barbiturates: NOT DETECTED
Benzodiazepines: NOT DETECTED
Cocaine: NOT DETECTED
Opiates: POSITIVE — AB
Tetrahydrocannabinol: NOT DETECTED

## 2019-07-06 LAB — URINALYSIS, MICROSCOPIC (REFLEX): RBC / HPF: NONE SEEN RBC/hpf (ref 0–5)

## 2019-07-06 LAB — COMPREHENSIVE METABOLIC PANEL
ALT: 5200 U/L — ABNORMAL HIGH (ref 0–44)
AST: 3448 U/L — ABNORMAL HIGH (ref 15–41)
Albumin: 3.8 g/dL (ref 3.5–5.0)
Alkaline Phosphatase: 192 U/L — ABNORMAL HIGH (ref 38–126)
Anion gap: 11 (ref 5–15)
BUN: 12 mg/dL (ref 6–20)
CO2: 20 mmol/L — ABNORMAL LOW (ref 22–32)
Calcium: 8.6 mg/dL — ABNORMAL LOW (ref 8.9–10.3)
Chloride: 99 mmol/L (ref 98–111)
Creatinine, Ser: 0.66 mg/dL (ref 0.61–1.24)
GFR calc Af Amer: >60 mL/min (ref >60)
GFR calc non Af Amer: >60 mL/min (ref >60)
Glucose, Bld: 128 mg/dL — ABNORMAL HIGH (ref 70–99)
Potassium: 4.3 mmol/L (ref 3.5–5.1)
Sodium: 130 mmol/L — ABNORMAL LOW (ref 135–145)
Total Bilirubin: 5.4 mg/dL — ABNORMAL HIGH (ref 0.3–1.2)
Total Protein: 7.9 g/dL (ref 6.5–8.1)

## 2019-07-06 LAB — CBC WITH DIFFERENTIAL/PLATELET
Abs Immature Granulocytes: 0.01 10*3/uL (ref 0.00–0.07)
Basophils Absolute: 0 10*3/uL (ref 0.0–0.1)
Basophils Relative: 1 %
Eosinophils Absolute: 0 10*3/uL (ref 0.0–0.5)
Eosinophils Relative: 0 %
HCT: 47.7 % (ref 39.0–52.0)
Hemoglobin: 16.1 g/dL (ref 13.0–17.0)
Immature Granulocytes: 0 %
Lymphocytes Relative: 35 %
Lymphs Abs: 1.6 10*3/uL (ref 0.7–4.0)
MCH: 30.7 pg (ref 26.0–34.0)
MCHC: 33.8 g/dL (ref 30.0–36.0)
MCV: 90.9 fL (ref 80.0–100.0)
Monocytes Absolute: 0.5 10*3/uL (ref 0.1–1.0)
Monocytes Relative: 10 %
Neutro Abs: 2.4 10*3/uL (ref 1.7–7.7)
Neutrophils Relative %: 54 %
Platelets: 244 10*3/uL (ref 150–400)
RBC: 5.25 MIL/uL (ref 4.22–5.81)
RDW: 13.3 % (ref 11.5–15.5)
WBC Morphology: >10
WBC: 4.5 10*3/uL (ref 4.0–10.5)
nRBC: 0 % (ref 0.0–0.2)

## 2019-07-06 LAB — CK TOTAL AND CKMB (NOT AT ARMC)
CK, MB: 1.6 ng/mL (ref 0.5–5.0)
Relative Index: INVALID (ref 0.0–2.5)
Total CK: 43 U/L — ABNORMAL LOW (ref 49–397)

## 2019-07-06 LAB — PROTIME-INR
INR: 1.8 — ABNORMAL HIGH (ref 0.8–1.2)
Prothrombin Time: 21.2 seconds — ABNORMAL HIGH (ref 11.4–15.2)

## 2019-07-06 LAB — HEPATITIS PANEL, ACUTE
HCV Ab: NONREACTIVE
Hep A IgM: REACTIVE — AB
Hep B C IgM: NONREACTIVE
Hepatitis B Surface Ag: NONREACTIVE

## 2019-07-06 LAB — URINALYSIS, ROUTINE W REFLEX MICROSCOPIC
Glucose, UA: NEGATIVE mg/dL
Hgb urine dipstick: NEGATIVE
Ketones, ur: NEGATIVE mg/dL
Leukocytes,Ua: NEGATIVE
Nitrite: NEGATIVE
Protein, ur: 30 mg/dL — AB
Specific Gravity, Urine: 1.025 (ref 1.005–1.030)
pH: 6 (ref 5.0–8.0)

## 2019-07-06 LAB — TSH: TSH: 0.909 u[IU]/mL (ref 0.350–4.500)

## 2019-07-06 LAB — GLUCOSE, CAPILLARY: Glucose-Capillary: 118 mg/dL — ABNORMAL HIGH (ref 70–99)

## 2019-07-06 LAB — LIPASE, BLOOD: Lipase: 51 U/L (ref 11–51)

## 2019-07-06 LAB — SARS CORONAVIRUS 2 AG (30 MIN TAT): SARS Coronavirus 2 Ag: NEGATIVE

## 2019-07-06 LAB — ACETAMINOPHEN LEVEL: Acetaminophen (Tylenol), Serum: <10 ug/mL — ABNORMAL LOW (ref 10–30)

## 2019-07-06 LAB — SARS CORONAVIRUS 2 (TAT 6-24 HRS): SARS Coronavirus 2: NEGATIVE

## 2019-07-06 MED ORDER — SODIUM CHLORIDE 0.9 % IV SOLN
Freq: Once | INTRAVENOUS | Status: AC
  Administered 2019-07-06: 15:00:00 via INTRAVENOUS

## 2019-07-06 MED ORDER — SODIUM CHLORIDE 0.9 % IV SOLN
INTRAVENOUS | Status: AC
  Administered 2019-07-06: 21:00:00 via INTRAVENOUS

## 2019-07-06 MED ORDER — SODIUM CHLORIDE 0.9 % IV BOLUS (SEPSIS)
1000.0000 mL | Freq: Once | INTRAVENOUS | Status: AC
  Administered 2019-07-06: 1000 mL via INTRAVENOUS

## 2019-07-06 MED ORDER — KETOROLAC TROMETHAMINE 30 MG/ML IJ SOLN
30.0000 mg | Freq: Once | INTRAMUSCULAR | Status: AC
  Administered 2019-07-06: 30 mg via INTRAVENOUS
  Filled 2019-07-06: qty 1

## 2019-07-06 MED ORDER — ONDANSETRON HCL 4 MG/2ML IJ SOLN
4.0000 mg | Freq: Once | INTRAMUSCULAR | Status: AC
  Administered 2019-07-06: 4 mg via INTRAVENOUS
  Filled 2019-07-06: qty 2

## 2019-07-06 MED ORDER — VITAMIN K1 10 MG/ML IJ SOLN
INTRAMUSCULAR | Status: AC
  Filled 2019-07-06: qty 1

## 2019-07-06 MED ORDER — VITAMIN K1 10 MG/ML IJ SOLN
10.0000 mg | Freq: Once | INTRAVENOUS | Status: AC
  Administered 2019-07-06: 10 mg via INTRAVENOUS
  Filled 2019-07-06: qty 1

## 2019-07-06 MED ORDER — NICOTINE 21 MG/24HR TD PT24
21.0000 mg | MEDICATED_PATCH | Freq: Every day | TRANSDERMAL | Status: DC
  Administered 2019-07-06 – 2019-07-09 (×4): 21 mg via TRANSDERMAL
  Filled 2019-07-06 (×4): qty 1

## 2019-07-06 NOTE — ED Provider Notes (Signed)
TIME SEEN: 6:11 AM  CHIEF COMPLAINT: Lower back pain, difficulty urinating, nausea and vomiting  HPI: Patient is a 38 year old male with history of anxiety, tobacco use who presents to the emergency department with several days of bilateral lower back pain, difficulty with urinating, nausea and vomiting.  Reports seeing blood in his urine.  No fevers, cough, diarrhea, abdominal pain.  No previous history of kidney stones.  No injury to the back.  Able to ambulate.  States he has had cold sweats.  No documented fevers.  ROS: See HPI Constitutional: no fever  Eyes: no drainage  ENT: no runny nose   Cardiovascular:  no chest pain  Resp: no SOB  GI: vomiting GU: no dysuria; + hematuria Integumentary: no rash  Allergy: no hives  Musculoskeletal: no leg swelling  Neurological: no slurred speech ROS otherwise negative  PAST MEDICAL HISTORY/PAST SURGICAL HISTORY:  Past Medical History:  Diagnosis Date  . Anxiety     MEDICATIONS:  Prior to Admission medications   Medication Sig Start Date End Date Taking? Authorizing Provider  amoxicillin-clavulanate (AUGMENTIN) 875-125 MG tablet Take 1 tablet by mouth every 12 (twelve) hours. Patient not taking: Reported on 06/22/2018 09/30/17   Long, Wonda Olds, MD  doxycycline (VIBRAMYCIN) 100 MG capsule Take 100 mg by mouth 2 (two) times daily. 06/22/18   [provider]  doxycycline (VIBRAMYCIN) 100 MG capsule Take 1 capsule (100 mg total) by mouth 2 (two) times daily. One po bid x 7 days 04/06/19   Etta Quill, NP  ibuprofen (ADVIL,MOTRIN) 200 MG tablet Take 600 mg by mouth every 6 (six) hours as needed for mild pain.    [provider]  ibuprofen (ADVIL,MOTRIN) 800 MG tablet Take 1 tablet (800 mg total) by mouth every 8 (eight) hours as needed. Patient not taking: Reported on 06/22/2018 09/30/17   Long, Wonda Olds, MD  permethrin (ELIMITE) 5 % cream Apply 1 application topically See admin instructions. Apply to affected area daily. Leave  on for 8 hours before showering 06/22/18   [provider]    ALLERGIES:  Allergies  Allergen Reactions  . Bee Venom Anaphylaxis    SOCIAL HISTORY:  Social History   Tobacco Use  . Smoking status: Current Every Day Smoker    Types: Cigarettes  . Smokeless tobacco: Never Used  Substance Use Topics  . Alcohol use: No    Frequency: Never    FAMILY HISTORY: No family history on file.  EXAM: BP (!) 132/102   Pulse 78   Temp 97.6 F (36.4 C) (Oral)   Resp 17   Ht 6\' 4"  (1.93 m)   Wt 99.8 kg   SpO2 99%   BMI 26.78 kg/m  CONSTITUTIONAL: Alert and oriented and responds appropriately to questions.  Appears uncomfortable but not in distress HEAD: Normocephalic EYES: Conjunctivae clear, pupils appear equal, EOM appear intact ENT: normal nose; moist mucous membranes NECK: Supple, normal ROM CARD: RRR; S1 and S2 appreciated; no murmurs, no clicks, no rubs, no gallops RESP: Normal chest excursion without splinting or tachypnea; breath sounds clear and equal bilaterally; no wheezes, no rhonchi, no rales, no hypoxia or respiratory distress, speaking full sentences ABD/GI: Normal bowel sounds; non-distended; soft, non-tender, no rebound, no guarding, no peritoneal signs, no hepatosplenomegaly BACK:  The back appears normal, no midline spinal tenderness or step-off or deformity, mild tenderness over the lumbar paraspinal muscles bilaterally without redness, warmth, ecchymosis or swelling EXT: Normal ROM in all joints; no deformity noted, no edema; no cyanosis  SKIN: Normal color for age and race; warm; no rash on exposed skin NEURO: Moves all extremities equally, normal speech, ambulates without difficulty PSYCH: The patient's mood and manner are appropriate.   MEDICAL DECISION MAKING: Patient here with bilateral lower back pain, difficulty urinating, nausea and vomiting.  Differential includes UTI, pyelonephritis, kidney stone.  Abdominal exam benign.  Doubt appendicitis,  colitis or diverticulitis, bowel obstruction or perforation.  Will obtain labs, urine, renal CT.  Will give Toradol, Zofran and IV fluids.  ED PROGRESS: Labs, urine, CT pending.  Signed out to oncoming ED physician.  I reviewed all nursing notes and pertinent previous records as available.  I have interpreted any EKGs, lab and urine results, imaging (as available).    Maurio Baize was evaluated in Emergency Department on 07/06/2019 for the symptoms described in the history of present illness. He was evaluated in the context of the global COVID-19 pandemic, which necessitated consideration that the patient might be at risk for infection with the SARS-CoV-2 virus that causes COVID-19. Institutional protocols and algorithms that pertain to the evaluation of patients at risk for COVID-19 are in a state of rapid change based on information released by regulatory bodies including the CDC and federal and state organizations. These policies and algorithms were followed during the patient's care in the ED.  Patient was seen wearing N95, face shield, gloves.    Tarryn Bogdan, Layla Maw, DO 07/06/19 [phone]

## 2019-07-06 NOTE — ED Provider Notes (Addendum)
Patient signed out to me at 7 AM with hematuria.  No history of kidney stones.  Getting basic labs, CT renal study.  Patient has bilateral lower back pain, nausea, vomiting.  Lab work and CT imaging is pending.  Patient with signs to suggest acute hepatitis.  AST is 3500, ALT is 5200, alk phos is 192, total bilirubin is 5.4.  INR is elevated at 1.8.  Tylenol level within normal limits.  UDS positive for opioids.  Patient denies heavy alcohol use.  States that he does have a history of IV drug abuse with opioids.  States that over the last 2 to 3 days has had nausea, vomiting.  No diarrhea.  No suspicious food intake.  No sick contacts.  Rapid coronavirus test is negative.  Acute hepatitis panel has been ordered.  Talked with GI PA from [place] from our [facility] system in which Dr. Havery Moros is the attending who recommends transfer to tertiary care center given acute hepatitis with elevated INR.  I have contacted [facility] and awaiting callback for admission.  Right upper quadrant ultrasound has been ordered for further evaluation.  CT scan of the abdomen and pelvis showed no acute findings.  Did show some hepatic steatosis but no gallstones or gallbladder wall thickening or biliary duct dilation.  No focal liver lesions as well.  [facility] states that there is about a 1 to 2-day waiting period for admission.  Therefore contacted [facility] and they also state that they have about a 2-day waiting at their facility.  I have reached out now to [facility] to see if they have any availability.  I have repaged Dr. Havery Moros who is aware of difficulty with placement at these facilities.  If I am unable to get patient a bed fairly quickly at [facility] may have patient admitted to our hospitalist service for the day and trend laboratory work.  Patient continues to be hemodynamically stable.  No signs to suggest encephalopathy.  He is requesting to eat.  Patient given vitamin K per Dr. Doyne Keel recommendations.  Awaiting callback  from [facility].  [facility] also without any bed for at least a day or 2.  Repaged gastroenterology and Dr. Havery Moros is willing to follow along at this time given that this appears to be the best option for the patient.  Recommends repeat INR tonight and LFTs in the morning and continue to follow mental status.  Patient overall doing well.  Currently is hungry.  Will admit to hospitalist service at [facility].  While patient was awaiting admission to [facility].  Hepatitis panel came positive for hepatitis A.  Medicine team aware.  This chart was dictated using voice recognition software.  Despite best efforts to proofread,  errors can occur which can change the documentation meaning.  .Critical Care Performed by: Lennice Sites, DO Authorized by: Lennice Sites, DO   Critical care provider statement:    Critical care time (minutes):  79   Critical care was necessary to treat or prevent imminent or life-threatening deterioration of the following conditions:  Hepatic failure   Critical care was time spent personally by me on the following activities:  Blood draw for specimens, development of treatment plan with patient or surrogate, discussions with consultants, discussions with primary provider, evaluation of patient's response to treatment, examination of patient, obtaining history from patient or surrogate, ordering and performing treatments and interventions, ordering and review of laboratory studies, ordering and review of radiographic studies, pulse oximetry, re-evaluation of patient's condition and review of old  charts   I assumed direction of critical care for this patient from another provider in my specialty: no        Lennice Sites, DO 07/06/19 [place], Andrew, DO 07/06/19 1401

## 2019-07-06 NOTE — H&P (Addendum)
TRH H&P    Patient Demographics:    Patrick Lynn, is a 38 y.o. male  MRN: [id number]  DOB - Aug 13, 1980  Admit Date - 07/06/2019  Referring MD/NP/PA:  Lennice Sites  Outpatient Primary MD for the patient is Patient, No Pcp Per  Patient coming from:  [place]  Chief complaint- abnormal liver function   HPI:    Patrick Lynn  is a 38 y.o. male, w anxiety apparently injecting water into arm, has had abdominal pain/ back pain for the past 3 days.  Pt points to the left side.  Pt notes n/v, and that his urine has been dark.   Pt denies fever, chills, cough, cp, palp, sob, diarrhea, brbpr, black stool, dysuria, hematuria.  Pt notes eating strawberries.   In ED,  T 97.6, P 78 R 17, Bp 132/102,  Pox 99% on RA Wt 99.8kg  CT abd/ pelvis IMPRESSION: 1. A cause for patient's symptoms has not been established with this study. 2. There is no appreciable renal or ureteral calculus. No hydronephrosis on either side. Urinary bladder wall thickness is within normal limits. 3. No bowel obstruction. No abscess in the abdomen or pelvis. Appendix appears normal. 4.  Hepatic steatosis. 5.  Spleen prominent without focal splenic lesion evident.  RUQ ultrasound IMPRESSION: 1. Contracted gallbladder with mild wall thickening. Negative sonographic Murphy's sign. No gallstones, sludge or pericholecystic fluid. 2. No intrahepatic bile duct dilatation. The common bile duct is not confidently identified.  Na 130, K 4.3, Bun 12, Creatinne 0.66 Ast 3,448, Alt 5,200, Alk phos 192, T. Bili 5.4 INR 1.8 Lipase 51 Wbc 4.5, Hgb 16.1, Plt 244 Urinalysis negative UDS opioid positive  Tylenol <10  Hep A IgM reactive  Sars covid negative  Vitamin K given in ED  Pt will be admitted for acute viral hepatitis.     Review of systems:    In addition to the HPI above,  No Fever-chills, No Headache, No changes  with Vision or hearing, No problems swallowing food or Liquids, No Chest pain, Cough or Shortness of Breath,  bowel movements are regular, No Blood in stool or Urine, No dysuria, No new skin rashes or bruises, No new joints pains-aches,  No new weakness, tingling, numbness in any extremity, No recent weight gain or loss, No polyuria, polydypsia or polyphagia, No significant Mental Stressors.  All other systems reviewed and are negative.    Past History of the following :    Past Medical History:  Diagnosis Date  . Abnormal liver function 07/06/2019   Acute Hepatitis A  . Anxiety       History reviewed. No pertinent surgical history.    Social History:      Social History   Tobacco Use  . Smoking status: Current Every Day Smoker    Packs/day: 1.00    Years: 12.00    Pack years: 12.00    Types: Cigarettes  . Smokeless tobacco: Never Used  Substance Use Topics  . Alcohol use: Yes    Frequency: Never  Comment: one glass a week       Family History :     Family History  Problem Relation Age of Onset  . COPD Father    No family hx of liver failure   Home Medications:   Prior to Admission medications   Not on File     Allergies:     Allergies  Allergen Reactions  . Bee Venom Anaphylaxis     Physical Exam:   Vitals  Blood pressure 115/82, pulse 60, temperature 97.6 F (36.4 C), temperature source Oral, resp. rate 19, height '6\' 4"'$  (1.93 m), weight 99.8 kg, SpO2 100 %.  1.  General: axoxo3  2. Psychiatric: euthymic  3. Neurologic: nonfocal  4. HEENMT:  Icteric, pupils 1.76m symmetric, direct, consensual, near intact Neck: no vd  5. Respiratory : CTAB  6. Cardiovascular : rrr s1, s2,   7. Gastrointestinal:  Abd: soft, nt, nd, +bs  8. Skin:  Ext: no c/c/e , no rash, no palmar erythema, no telangiectasia No asterixis  9.Musculoskeletal:  Good ROM    Data Review:    CBC Recent Labs  Lab 07/06/19 0630  WBC 4.5  HGB  16.1  HCT 47.7  PLT 244  MCV 90.9  MCH 30.7  MCHC 33.8  RDW 13.3  LYMPHSABS 1.6  MONOABS 0.5  EOSABS 0.0  BASOSABS 0.0   ------------------------------------------------------------------------------------------------------------------  Results for orders placed or performed during the hospital encounter of 07/06/19 (from the past 48 hour(s))  CBC with Differential     Status: None   Collection Time: 07/06/19  6:30 AM  Result Value Ref Range   WBC 4.5 4.0 - 10.5 K/uL   RBC 5.25 4.22 - 5.81 MIL/uL   Hemoglobin 16.1 13.0 - 17.0 g/dL   HCT 47.7 39.0 - 52.0 %   MCV 90.9 80.0 - 100.0 fL   MCH 30.7 26.0 - 34.0 pg   MCHC 33.8 30.0 - 36.0 g/dL   RDW 13.3 11.5 - 15.5 %   Platelets 244 150 - 400 K/uL   nRBC 0.0 0.0 - 0.2 %   Neutrophils Relative % 54 %   Neutro Abs 2.4 1.7 - 7.7 K/uL   Lymphocytes Relative 35 %   Lymphs Abs 1.6 0.7 - 4.0 K/uL   Monocytes Relative 10 %   Monocytes Absolute 0.5 0.1 - 1.0 K/uL   Eosinophils Relative 0 %   Eosinophils Absolute 0.0 0.0 - 0.5 K/uL   Basophils Relative 1 %   Basophils Absolute 0.0 0.0 - 0.1 K/uL   WBC Morphology >10% Reactive Benign Lymphoctyes     Comment: TOXIC GRANULATION   RBC Morphology MORPHOLOGY UNREMARKABLE    Smear Review PLATELET COUNT CONFIRMED BY SMEAR    Immature Granulocytes 0 %   Abs Immature Granulocytes 0.01 0.00 - 0.07 K/uL   Giant PLTs PRESENT     Comment: Performed at [facility] [id number] Comprehensive metabolic panel     Status: Abnormal   Collection Time: 07/06/19  6:30 AM  Result Value Ref Range   Sodium 130 (L) 135 - 145 mmol/L   Potassium 4.3 3.5 - 5.1 mmol/L   Chloride 99 98 - 111 mmol/L   CO2 20 (L) 22 - 32 mmol/L   Glucose, Bld 128 (H) 70 - 99 mg/dL   BUN 12 6 - 20 mg/dL   Creatinine, Ser 0.66 0.61 - 1.24 mg/dL   Calcium 8.6 (L) 8.9 - 10.3 mg/dL  Total Protein 7.9 6.5 - 8.1 g/dL   Albumin 3.8 3.5 - 5.0 g/dL   AST 3,448 (H) 15 - 41 U/L    Comment:  RESULTS CONFIRMED BY MANUAL DILUTION   ALT 5,200 (H) 0 - 44 U/L    Comment: RESULTS CONFIRMED BY MANUAL DILUTION   Alkaline Phosphatase 192 (H) 38 - 126 U/L   Total Bilirubin 5.4 (H) 0.3 - 1.2 mg/dL   GFR calc non Af Amer >60 >60 mL/min   GFR calc Af Amer >60 >60 mL/min   Anion gap 11 5 - 15    Comment: Performed at [facility], [address] [place], [place] [address]  Lipase, blood     Status: None   Collection Time: 07/06/19  6:30 AM  Result Value Ref Range   Lipase 51 11 - 51 U/L    Comment: Performed at [facility], [address] [place], [place] [address]  Urinalysis, Routine w reflex microscopic     Status: Abnormal   Collection Time: 07/06/19  7:28 AM  Result Value Ref Range   Color, Urine AMBER (A) YELLOW    Comment: BIOCHEMICALS MAY BE AFFECTED BY COLOR   APPearance CLEAR CLEAR   Specific Gravity, Urine 1.025 1.005 - 1.030   pH 6.0 5.0 - 8.0   Glucose, UA NEGATIVE NEGATIVE mg/dL   Hgb urine dipstick NEGATIVE NEGATIVE   Bilirubin Urine LARGE (A) NEGATIVE   Ketones, ur NEGATIVE NEGATIVE mg/dL   Protein, ur 30 (A) NEGATIVE mg/dL   Nitrite NEGATIVE NEGATIVE   Leukocytes,Ua NEGATIVE NEGATIVE    Comment: Performed at [facility], [address] [place], [place] [address]  Urinalysis, Microscopic (reflex)     Status: Abnormal   Collection Time: 07/06/19  7:28 AM  Result Value Ref Range   RBC / HPF NONE SEEN 0 - 5 RBC/hpf   WBC, UA 0-5 0 - 5 WBC/hpf   Bacteria, UA MANY (A) NONE SEEN   Squamous Epithelial / LPF 0-5 0 - 5   Mucus PRESENT    Granular Casts, UA PRESENT    Amorphous Crystal PRESENT     Comment: Performed at [facility], [address] [place], [place] [address]  Rapid urine drug screen (hospital performed)     Status: Abnormal   Collection Time: 07/06/19  7:28 AM  Result Value Ref Range   Opiates POSITIVE (A) NONE DETECTED   Cocaine NONE DETECTED NONE DETECTED   Benzodiazepines NONE DETECTED NONE  DETECTED   Amphetamines NONE DETECTED NONE DETECTED   Tetrahydrocannabinol NONE DETECTED NONE DETECTED   Barbiturates NONE DETECTED NONE DETECTED    Comment: (NOTE) DRUG SCREEN FOR MEDICAL PURPOSES ONLY.  IF CONFIRMATION IS NEEDED FOR ANY PURPOSE, NOTIFY LAB WITHIN 5 DAYS. LOWEST DETECTABLE LIMITS FOR URINE DRUG SCREEN Drug Class                     Cutoff (ng/mL) Amphetamine and metabolites    1000 Barbiturate and metabolites    200 Benzodiazepine                 244 Tricyclics and metabolites     300 Opiates and metabolites        300 Cocaine and metabolites        300 THC  50 Performed at [facility], [address] [place], [place] [address]   Protime-INR     Status: Abnormal   Collection Time: 07/06/19  8:36 AM  Result Value Ref Range   Prothrombin Time 21.2 (H) 11.4 - 15.2 seconds   INR 1.8 (H) 0.8 - 1.2    Comment: (NOTE) INR goal varies based on device and disease states. Performed at [facility], [address] [place], [place] [address]   Acetaminophen level     Status: Abnormal   Collection Time: 07/06/19  8:36 AM  Result Value Ref Range   Acetaminophen (Tylenol), Serum <10 (L) 10 - 30 ug/mL    Comment: Performed at [facility], [address] [place], [place] [address]  Hepatitis panel, acute     Status: Abnormal   Collection Time: 07/06/19  8:36 AM  Result Value Ref Range   Hepatitis B Surface Ag NON REACTIVE NON REACTIVE   HCV Ab NON REACTIVE NON REACTIVE    Comment: (NOTE) Nonreactive HCV antibody screen is consistent with no HCV infections,  unless recent infection is suspected or other evidence exists to indicate HCV infection.    Hep A IgM Reactive (A) NON REACTIVE   Hep B C IgM NON REACTIVE NON REACTIVE    Comment: Performed at [facility], [place] [address], [place], [place] [address]  SARS Coronavirus 2 Ag (30 min TAT) - Nasal Swab (BD Veritor Kit)     Status: None   Collection  Time: 07/06/19  8:36 AM   Specimen: Nasal Swab (BD Veritor Kit)  Result Value Ref Range   SARS Coronavirus 2 Ag NEGATIVE NEGATIVE    Comment: (NOTE) SARS-CoV-2 antigen NOT DETECTED.  Negative results are presumptive.  Negative results do not preclude SARS-CoV-2 infection and should not be used as the sole basis for treatment or other patient management decisions, including infection  control decisions, particularly in the presence of clinical signs and  symptoms consistent with COVID-19, or in those who have been in contact with the virus.  Negative results must be combined with clinical observations, patient history, and epidemiological information. The expected result is Negative. Fact Sheet for Patients: PodPark.tn Fact Sheet for Healthcare Providers: GiftContent.is This test is not yet approved or cleared by the [place] FDA and  has been authorized for detection and/or diagnosis of SARS-CoV-2 by FDA under an Emergency Use Authorization (EUA).  This EUA will remain in effect (meaning this test can be used) for the duration of  the COVID-19 de claration under Section 564(b)(1) of the Act, 21 U.S.C. section 360bbb-3(b)(1), unless the authorization is terminated or revoked sooner. Performed at [facility], [address] [place], [place] [address]   SARS CORONAVIRUS 2 (TAT 6-24 HRS) Nasopharyngeal Nasopharyngeal Swab     Status: None   Collection Time: 07/06/19  9:51 AM   Specimen: Nasopharyngeal Swab  Result Value Ref Range   SARS Coronavirus 2 NEGATIVE NEGATIVE    Comment: (NOTE) SARS-CoV-2 target nucleic acids are NOT DETECTED. The SARS-CoV-2 RNA is generally detectable in upper and lower respiratory specimens during the acute phase of infection. Negative results do not preclude SARS-CoV-2 infection, do not rule out co-infections with other pathogens, and should not be used as the sole basis for  treatment or other patient management decisions. Negative results must be combined with clinical observations, patient history, and epidemiological information. The expected result is Negative. Fact Sheet for  Patients: SugarRoll.be Fact Sheet for Healthcare Providers: https://www.woods-mathews.com/ This test is not yet approved or cleared by the [place] FDA and  has been authorized for detection and/or diagnosis of SARS-CoV-2 by FDA under an Emergency Use Authorization (EUA). This EUA will remain  in effect (meaning this test can be used) for the duration of the COVID-19 declaration under Section 56 4(b)(1) of the Act, 21 U.S.C. section 360bbb-3(b)(1), unless the authorization is terminated or revoked sooner. Performed at [facility], [place] [address] [place], [place] [address]   TSH     Status: None   Collection Time: 07/06/19  8:31 PM  Result Value Ref Range   TSH 0.909 0.350 - 4.500 uIU/mL    Comment: Performed by a 3rd Generation assay with a functional sensitivity of <=0.01 uIU/mL. Performed at [facility], [place] [address] [place], [place] [address]     Chemistries  Recent Labs  Lab 07/06/19 0630  NA 130*  K 4.3  CL 99  CO2 20*  GLUCOSE 128*  BUN 12  CREATININE 0.66  CALCIUM 8.6*  AST 3,448*  ALT 5,200*  ALKPHOS 192*  BILITOT 5.4*   ------------------------------------------------------------------------------------------------------------------  ------------------------------------------------------------------------------------------------------------------ GFR: Estimated Creatinine Clearance: 150.7 mL/min (by C-G formula based on SCr of 0.66 mg/dL). Liver Function Tests: Recent Labs  Lab 07/06/19 0630  AST 3,448*  ALT 5,200*  ALKPHOS 192*  BILITOT 5.4*  PROT 7.9  ALBUMIN 3.8   Recent Labs  Lab 07/06/19 0630  LIPASE 51   No results for input(s): AMMONIA in the last 168  hours. Coagulation Profile: Recent Labs  Lab 07/06/19 0836  INR 1.8*   Cardiac Enzymes: No results for input(s): CKTOTAL, CKMB, CKMBINDEX, TROPONINI in the last 168 hours. BNP (last 3 results) No results for input(s): PROBNP in the last 8760 hours. HbA1C: No results for input(s): HGBA1C in the last 72 hours. CBG: No results for input(s): GLUCAP in the last 168 hours. Lipid Profile: No results for input(s): CHOL, HDL, LDLCALC, TRIG, CHOLHDL, LDLDIRECT in the last 72 hours. Thyroid Function Tests: Recent Labs    07/06/19 2031  TSH 0.909   Anemia Panel: No results for input(s): VITAMINB12, FOLATE, FERRITIN, TIBC, IRON, RETICCTPCT in the last 72 hours.  --------------------------------------------------------------------------------------------------------------- Urine analysis:    Component Value Date/Time   COLORURINE AMBER (A) 07/06/2019 0728   APPEARANCEUR CLEAR 07/06/2019 0728   LABSPEC 1.025 07/06/2019 0728   PHURINE 6.0 07/06/2019 0728   GLUCOSEU NEGATIVE 07/06/2019 0728   HGBUR NEGATIVE 07/06/2019 0728   BILIRUBINUR LARGE (A) 07/06/2019 0728   KETONESUR NEGATIVE 07/06/2019 0728   PROTEINUR 30 (A) 07/06/2019 0728   NITRITE NEGATIVE 07/06/2019 0728   LEUKOCYTESUR NEGATIVE 07/06/2019 0728      Imaging Results:    Ct Renal Stone Study  Result Date: 07/06/2019 CLINICAL DATA:  Flank pain and hematuria EXAM: CT ABDOMEN AND PELVIS WITHOUT CONTRAST TECHNIQUE: Multidetector CT imaging of the abdomen and pelvis was performed following the standard protocol without oral or IV contrast. COMPARISON:  June 22, 2018 FINDINGS: Lower chest: Lung bases are clear. Hepatobiliary: There is hepatic steatosis. No focal liver lesions are evident on this noncontrast enhanced study. The gallbladder wall is not appreciably thickened. There is no biliary duct dilatation. Pancreas: There is no pancreatic mass or inflammatory focus. Spleen: Spleen measures 13.6 x 11.9 x 5.8 cm with a  measures splenic volume of 469 cubic cm. No focal splenic lesions are evident. Adrenals/Urinary Tract: Adrenals bilaterally appear normal. Kidneys bilaterally show no  evident mass or hydronephrosis on either side. There is no renal or ureteral calculus appreciable on either side. Urinary bladder is midline with wall thickness within normal limits. Stomach/Bowel: There is no appreciable bowel wall or mesenteric thickening. No evident bowel obstruction. Terminal ileum appears normal. There is no evident free air or portal venous air. Vascular/Lymphatic: There is no abdominal aortic aneurysm. No vascular lesions are demonstrable on this noncontrast enhanced study. There is no appreciable adenopathy in the abdomen or pelvis. Reproductive: Prostate and seminal vesicles are normal in size and contour. No pelvic masses are evident. Other: Appendix appears normal. There is no evident abscess or ascites in the abdomen or pelvis. Musculoskeletal: There are no blastic or lytic bone lesions. No intramuscular or abdominal wall lesions are evident. IMPRESSION: 1. A cause for patient's symptoms has not been established with this study. 2. There is no appreciable renal or ureteral calculus. No hydronephrosis on either side. Urinary bladder wall thickness is within normal limits. 3. No bowel obstruction. No abscess in the abdomen or pelvis. Appendix appears normal. 4.  Hepatic steatosis. 5.  Spleen prominent without focal splenic lesion evident. Electronically Signed   By: Lowella Grip III M.D.   On: 07/06/2019 07:35   US Abdomen Limited Ruq  Result Date: 07/06/2019 CLINICAL DATA:  Elevated bilirubin EXAM: ULTRASOUND ABDOMEN LIMITED RIGHT UPPER QUADRANT COMPARISON:  None. FINDINGS: Gallbladder: Gallbladder is contracted. The gallbladder wall measures 3.4 mm in thickness. Patient reports being NPO for 6 hours. No sonographic Murphy's sign. No gallstones or pericholecystic fluid. Common bile duct: Diameter: Not visualized  Liver: No focal lesion identified. Within normal limits in parenchymal echogenicity. Portal vein is patent on color Doppler imaging with normal direction of blood flow towards the liver. Other: None. IMPRESSION: 1. Contracted gallbladder with mild wall thickening. Negative sonographic Murphy's sign. No gallstones, sludge or pericholecystic fluid. 2. No intrahepatic bile duct dilatation. The common bile duct is not confidently identified. Electronically Signed   By: Kerby Moors M.D.   On: 07/06/2019 10:15       Assessment & Plan:    Active Problems:   Hepatitis A antibody positive   Abnormal liver function  Acute viral hepatitis Abnormal liver function Check tsh, cpk Check cmp, INR in am Pt counselled on etoh cessation Pt counselled against ivda Pt counselled against further tatoos Please get vaccinated against hepatitis B as outpatient Please consult GI in am since INR >1.5 indicates synthetic dysfunction, if liver function worsening may need transfer to transplant center.     DVT Prophylaxis-    SCDs   AM Labs Ordered, also please review Full Orders  Family Communication: Admission, patients condition and plan of care including tests being ordered have been discussed with the patient who indicate understanding and agree with the plan and Code Status Code Status:  FULL CODE per patient, notified father that patient is admitted to [facility]  Admission status:  Inpatient: Based on patients clinical presentation and evaluation of above clinical data, I have made determination that patient meets Inpatient criteria at this time. Pt has acute viral hepatitis, at risk for fulminant hepatic failure  Time spent in minutes : 70    Jani Gravel M.D on 07/06/2019 at 9:25 PM

## 2019-07-06 NOTE — ED Notes (Signed)
US at bedside

## 2019-07-06 NOTE — Progress Notes (Signed)
Admission from [facility] c/o dark urine   AST/ALT - 3448/5200, total bili 5.4 INR 1.8 Hepatitis A IgM positive No IV drug use in month  [place] abd: 1. Contracted gallbladder with mild wall thickening. Negative sonographic Murphy's sign. No gallstones, sludge or pericholecystic fluid. 2. No intrahepatic bile duct dilatation. The common bile duct is not confidently identified.  Accepted in Med surg bed as inpatient.

## 2019-07-06 NOTE — Discharge Instructions (Signed)
Steps to find a Primary Care Provider (PCP): ° °Call [phone] or [phone] to access "[facility] Find a Doctor Service." ° °2.  You may also go on the [facility] website at [email or web address] ° °3.  [facility] also frequently accepts new patients. ° °[facility]  °[address] °[place] [address] °[phone] ° °4.  There are also multiple [facility], [facility], [facility] and [facility] practices throughout the [place] that are frequently accepting new patients. You may find a clinic that is close to your home and contact them. ° °[facility] °[email or web address] °[phone] ° °[facility] °[email or web address] ° °[facility] °[email or web address] °[phone] ° °[facility] °[email or web address] °[phone] ° °5.  Local Health Departments also can provide primary care services. ° °[facility]  °[address] °[place] [address] °[phone] ° °[facility] °[address] °[place] [address] °[phone] ° °[facility] °[address]  °[place]  [address] °[phone] ° ° °

## 2019-07-06 NOTE — ED Triage Notes (Signed)
Pt presents with c/o bilat flank pain, hematuria, nausea/vomiting x 3 days.

## 2019-07-06 NOTE — ED Notes (Signed)
Patient transported to CT 

## 2019-07-06 NOTE — ED Notes (Signed)
Confirmed with Dr. Ronnald Nian if patient can have something to drink, positive.  [place] staff is currently at bedside for the [place] procedure.

## 2019-07-06 NOTE — ED Notes (Signed)
PT arguing with [facility] about not getting in stretcher for transport.

## 2019-07-06 NOTE — ED Notes (Signed)
Pt knife given to security-

## 2019-07-06 NOTE — ED Notes (Signed)
Report to [facility] ETA 30 minutes

## 2019-07-06 NOTE — ED Notes (Signed)
Pt provided insufficient amount for urine sample- asked to recollect shortly. Fluids running.

## 2019-07-07 DIAGNOSIS — R945 Abnormal results of liver function studies: Secondary | ICD-10-CM

## 2019-07-07 DIAGNOSIS — R768 Other specified abnormal immunological findings in serum: Secondary | ICD-10-CM

## 2019-07-07 DIAGNOSIS — B159 Hepatitis A without hepatic coma: Principal | ICD-10-CM

## 2019-07-07 LAB — COMPREHENSIVE METABOLIC PANEL
ALT: 3329 U/L — ABNORMAL HIGH (ref 0–44)
AST: 1296 U/L — ABNORMAL HIGH (ref 15–41)
Albumin: 2.9 g/dL — ABNORMAL LOW (ref 3.5–5.0)
Alkaline Phosphatase: 141 U/L — ABNORMAL HIGH (ref 38–126)
Anion gap: 9 (ref 5–15)
BUN: 10 mg/dL (ref 6–20)
CO2: 24 mmol/L (ref 22–32)
Calcium: 8 mg/dL — ABNORMAL LOW (ref 8.9–10.3)
Chloride: 105 mmol/L (ref 98–111)
Creatinine, Ser: 0.78 mg/dL (ref 0.61–1.24)
GFR calc Af Amer: >60 mL/min (ref >60)
GFR calc non Af Amer: >60 mL/min (ref >60)
Glucose, Bld: 101 mg/dL — ABNORMAL HIGH (ref 70–99)
Potassium: 3.9 mmol/L (ref 3.5–5.1)
Sodium: 138 mmol/L (ref 135–145)
Total Bilirubin: 4.1 mg/dL — ABNORMAL HIGH (ref 0.3–1.2)
Total Protein: 6 g/dL — ABNORMAL LOW (ref 6.5–8.1)

## 2019-07-07 LAB — URINE CULTURE: Culture: NO GROWTH

## 2019-07-07 LAB — CBC
HCT: 43.2 % (ref 39.0–52.0)
Hemoglobin: 14.4 g/dL (ref 13.0–17.0)
MCH: 31.4 pg (ref 26.0–34.0)
MCHC: 33.3 g/dL (ref 30.0–36.0)
MCV: 94.3 fL (ref 80.0–100.0)
Platelets: 218 10*3/uL (ref 150–400)
RBC: 4.58 MIL/uL (ref 4.22–5.81)
RDW: 14 % (ref 11.5–15.5)
WBC: 3.5 10*3/uL — ABNORMAL LOW (ref 4.0–10.5)
nRBC: 0 % (ref 0.0–0.2)

## 2019-07-07 LAB — HIV ANTIBODY (ROUTINE TESTING W REFLEX): HIV Screen 4th Generation wRfx: NONREACTIVE

## 2019-07-07 LAB — PROTIME-INR
INR: 1.4 — ABNORMAL HIGH (ref 0.8–1.2)
Prothrombin Time: 16.8 seconds — ABNORMAL HIGH (ref 11.4–15.2)

## 2019-07-07 MED ORDER — ONDANSETRON HCL 4 MG/2ML IJ SOLN
4.0000 mg | Freq: Four times a day (QID) | INTRAMUSCULAR | Status: DC | PRN
  Administered 2019-07-07 – 2019-07-09 (×5): 4 mg via INTRAVENOUS
  Filled 2019-07-07 (×5): qty 2

## 2019-07-07 MED ORDER — OXYCODONE-ACETAMINOPHEN 5-325 MG PO TABS
1.0000 | ORAL_TABLET | Freq: Four times a day (QID) | ORAL | Status: DC | PRN
  Administered 2019-07-07 – 2019-07-09 (×5): 1 via ORAL
  Filled 2019-07-07 (×5): qty 1

## 2019-07-07 NOTE — Progress Notes (Signed)
PROGRESS NOTE  Patrick Lynn  DOB: 09/20/1980  PCP: Patient, No Pcp Per TLX:[id number]  DOA: 07/06/2019  LOS: 1 day   Chief Complaint  Patient presents with  . Hematuria   Brief narrative: Patrick Lynn is a 38 y.o. male with past medical history of anxiety and IV drug abuse.   Patient presented to the ED on 12/2 with complaint of abdominal pain and nausea for 3 to 4 days. Apparently, he was injecting water into his arm to help hydrate him and make him feel better. He noted that his eyes started to become yellow. In the ED, patient was afebrile and hemodynamically stable. Work-up showed significantly elevated LFTs with AST/ALT/alk phos/total bili/INR being 3448/5200/192/5 0.4/1.8 CT scan of abdomen pelvis showed hepatic steatosis. Acute hepatitis panel showed IgM positive for hepatitis A.  Patient was admitted to hospitalist medicine service for acute hepatitis A.  Subjective: Patient was seen and examined this morning.  Pleasant young Caucasian male.  Sitting up at the edge of the bed.  Not in distress.  Improving symptoms.  Was not able to tolerate regular diet today.   Assessment/Plan: Acute hepatitis A -Presented with nausea, vomiting, abdominal pain for 3 to 4 days. -Acute hepatitis panel showed IgM positive for hepatitis A. -Liver enzymes elevated as above. -Currently on conservative management. -Repeat liver enzyme test today showed improvement in AST/ALT/ALP/total bili/INR 2 1296/3329/141/4 0.1/1.4. -Repeat labs tomorrow. -Patient stated that this morning he could not tolerate regular diet.  I will scale him back to full liquid diet. -Continue symptomatic management.  IV Zofran ordered.  History of drug abuse -Patient denies using any drugs for last several months. -On as needed Percocet for pain control.  Mobility: Encourage ambulation Diet: Full liquid diet Fluid: None IV fluids DVT prophylaxis:  Encourage ambulation Code Status:  Full code Family  Communication:  Patient's father was at bedside today Expected Discharge:  Home in 1 to 2 days  Consultants:  GI  Procedures:    Antimicrobials: Anti-infectives (From admission, onward)   None        Code Status: Full Code   Diet Order            Diet regular Room service appropriate? Yes; Fluid consistency: Thin  Diet effective now              Infusions:    Scheduled Meds: . nicotine  21 mg Transdermal Daily    PRN meds: ondansetron, oxyCODONE-acetaminophen   Objective: Vitals:   07/07/19 0531 07/07/19 1351  BP: 128/70 96/68  Pulse: 63 (!) 53  Resp: 18 15  Temp: 97.6 F (36.4 C) 97.7 F (36.5 C)  SpO2: 98% 97%    Intake/Output Summary (Last 24 hours) at 07/07/2019 1511 Last data filed at 07/07/2019 0900 Gross per 24 hour  Intake 1009.59 ml  Output 200 ml  Net 809.59 ml   Filed Weights   07/06/19 0559 07/07/19 0531  Weight: 99.8 kg 98.1 kg   Weight change: -1.691 kg Body mass index is 26.33 kg/m.   Physical Exam: General exam: Appears calm and comfortable.  Skin: No rashes, lesions or ulcers. HEENT: Atraumatic, normocephalic, supple neck, no obvious bleeding Lungs: Clear to auscultation bilaterally CVS: Regular rate and rhythm, no murmur GI/Abd soft, nontender, nondistended, bowel sound present CNS: Alert, awake monitor x3 Psychiatry: Mood appropriate Extremities: No pedal edema, no calf tenderness  Data Review: I have personally reviewed the laboratory data and studies available.  Recent Labs  Lab 07/06/19 0630 07/07/19 0532  WBC 4.5 3.5*  NEUTROABS 2.4  --   HGB 16.1 14.4  HCT 47.7 43.2  MCV 90.9 94.3  PLT 244 218   Recent Labs  Lab 07/06/19 0630 07/07/19 0532  NA 130* 138  K 4.3 3.9  CL 99 105  CO2 20* 24  GLUCOSE 128* 101*  BUN 12 10  CREATININE 0.66 0.78  CALCIUM 8.6* 8.0*   Recent Labs  Lab 07/06/19 0630 07/07/19 0532  AST 3,448* 1,296*  ALT 5,200* 3,329*  ALKPHOS 192* 141*  BILITOT 5.4* 4.1*  PROT 7.9  6.0*  ALBUMIN 3.8 2.9*   Recent Labs  Lab 07/06/19 0836 07/07/19 1013  INR 1.8* 1.4*    Terrilee Croak, MD  [facility] 07/07/2019

## 2019-07-07 NOTE — Progress Notes (Signed)
Referring Provider: No ref. provider found Primary Care Physician:  Patient, No Pcp Per Primary Gastroenterologist:  Unassigned  Reason for Consultation:  Elevated LFT's and acute Hep A  HPI: Lamichael Youkhana is a 38 y.o. male with past medical history of anxiety and IV drug abuse.  He has been having abdominal pain and nausea for the past 3 to 4 days.  Apparently he was injecting water into his arm to help hydrate him and make him feel better.  He noted that his eyes started to become yellow.  Found to have significantly elevated LFTs.  They are as follows:  AST 3448 to 1296, ALT 5200 to 3329, ALP 192 to 141, and total bili 5.4 to 4.1.  INR was 1.8 down to 1.4 today.  LFTs improved today significantly as shown above.  Was found to have acute hepatitis A.  CT scan of the abdomen pelvis without contrast showed hepatic steatosis, but no cause for patient's symptoms.  Sound was then also unremarkable.  He does report some abdominal pain and nausea this morning, but ate breakfast and has not vomited, but says that it did make him very nauseous.   Past Medical History:  Diagnosis Date  . Abnormal liver function 07/06/2019   Acute Hepatitis A  . Anxiety     History reviewed. No pertinent surgical history.  Prior to Admission medications   Not on File    Current Facility-Administered Medications  Medication Dose Route Frequency Provider Last Rate Last Dose  . nicotine (NICODERM CQ - dosed in mg/24 hours) patch 21 mg  21 mg Transdermal Daily Jani Gravel, MD   21 mg at 07/06/19 2058  . oxyCODONE-acetaminophen (PERCOCET/ROXICET) 5-325 MG per tablet 1 tablet  1 tablet Oral Q6H PRN Terrilee Croak, MD        Allergies as of 07/06/2019 - Review Complete 07/06/2019  Allergen Reaction Noted  . Bee venom Anaphylaxis 06/22/2018    Family History  Problem Relation Age of Onset  . COPD Father     Social History   Socioeconomic History  . Marital status: Single    Spouse name: Not on  file  . Number of children: Not on file  . Years of education: Not on file  . Highest education level: Not on file  Occupational History  . Not on file  Social Needs  . Financial resource strain: Patient refused  . Food insecurity    Worry: Patient refused    Inability: Patient refused  . Transportation needs    Medical: No    Non-medical: Yes  Tobacco Use  . Smoking status: Current Every Day Smoker    Packs/day: 1.00    Years: 12.00    Pack years: 12.00    Types: Cigarettes  . Smokeless tobacco: Never Used  Substance and Sexual Activity  . Alcohol use: Yes    Frequency: Never    Comment: one glass a week  . Drug use: No  . Sexual activity: Not on file  Lifestyle  . Physical activity    Days per week: 0 days    Minutes per session: Not on file  . Stress: Very much  Relationships  . Social Herbalist on phone: Once a week    Gets together: Once a week    Attends religious service: Never    Active member of club or organization: No    Attends meetings of clubs or organizations: Never    Relationship status: Divorced  . Intimate  partner violence    Fear of current or ex partner: No    Emotionally abused: No    Physically abused: No    Forced sexual activity: No  Other Topics Concern  . Not on file  Social History Narrative  . Not on file    Review of Systems: ROS is O/W negative except as mentioned in HPI.  Physical Exam: Vital signs in last 24 hours: Temp:  [97.6 F (36.4 C)-98.5 F (36.9 C)] 97.6 F (36.4 C) (12/03 0531) Pulse Rate:  [60-114] 63 (12/03 0531) Resp:  [14-20] 18 (12/03 0531) BP: (99-128)/(66-82) 128/70 (12/03 0531) SpO2:  [96 %-100 %] 98 % (12/03 0531) Weight:  [98.1 kg] 98.1 kg (12/03 0531) Last BM Date: 07/04/19 General:  Alert, Well-developed, well-nourished, pleasant and cooperative in NAD Head:  Normocephalic and atraumatic. Eyes:  Mild scleral icterus. Ears:  Normal auditory acuity. Mouth:  No deformity or lesions.    Lungs:  Clear throughout to auscultation.  No wheezes, crackles, or rhonchi.  Heart:  Regular rate and rhythm; no murmurs, clicks, rubs, or gallops. Abdomen:  Soft, non-distended.  BS present.  Mild upper abdominal TTP.  Msk:  Symmetrical without gross deformities. Pulses:  Normal pulses noted. Extremities:  Without clubbing or edema. Neurologic:  Alert and oriented x 4;  grossly normal neurologically. Skin:  Intact without significant lesions or rashes. Psych:  Alert and cooperative. Normal mood and affect.  Intake/Output from previous day: 12/02 0701 - 12/03 0700 In: 1819.8 [I.V.:769.6; IV Piggyback:1050.2] Out: 200 [Urine:200]  Lab Results: Recent Labs    07/06/19 0630 07/07/19 0532  WBC 4.5 3.5*  HGB 16.1 14.4  HCT 47.7 43.2  PLT 244 218   BMET Recent Labs    07/06/19 0630 07/07/19 0532  NA 130* 138  K 4.3 3.9  CL 99 105  CO2 20* 24  GLUCOSE 128* 101*  BUN 12 10  CREATININE 0.66 0.78  CALCIUM 8.6* 8.0*   LFT Recent Labs    07/07/19 0532  PROT 6.0*  ALBUMIN 2.9*  AST 1,296*  ALT 3,329*  ALKPHOS 141*  BILITOT 4.1*   PT/INR Recent Labs    07/06/19 0836  LABPROT 21.2*  INR 1.8*   Hepatitis Panel Recent Labs    07/06/19 0836  HEPBSAG NON REACTIVE  HCVAB NON REACTIVE  HEPAIGM Reactive*  HEPBIGM NON REACTIVE   Studies/Results: Ct Renal Stone Study  Result Date: 07/06/2019 CLINICAL DATA:  Flank pain and hematuria EXAM: CT ABDOMEN AND PELVIS WITHOUT CONTRAST TECHNIQUE: Multidetector CT imaging of the abdomen and pelvis was performed following the standard protocol without oral or IV contrast. COMPARISON:  June 22, 2018 FINDINGS: Lower chest: Lung bases are clear. Hepatobiliary: There is hepatic steatosis. No focal liver lesions are evident on this noncontrast enhanced study. The gallbladder wall is not appreciably thickened. There is no biliary duct dilatation. Pancreas: There is no pancreatic mass or inflammatory focus. Spleen: Spleen measures  13.6 x 11.9 x 5.8 cm with a measures splenic volume of 469 cubic cm. No focal splenic lesions are evident. Adrenals/Urinary Tract: Adrenals bilaterally appear normal. Kidneys bilaterally show no evident mass or hydronephrosis on either side. There is no renal or ureteral calculus appreciable on either side. Urinary bladder is midline with wall thickness within normal limits. Stomach/Bowel: There is no appreciable bowel wall or mesenteric thickening. No evident bowel obstruction. Terminal ileum appears normal. There is no evident free air or portal venous air. Vascular/Lymphatic: There is no abdominal aortic aneurysm. No vascular lesions  are demonstrable on this noncontrast enhanced study. There is no appreciable adenopathy in the abdomen or pelvis. Reproductive: Prostate and seminal vesicles are normal in size and contour. No pelvic masses are evident. Other: Appendix appears normal. There is no evident abscess or ascites in the abdomen or pelvis. Musculoskeletal: There are no blastic or lytic bone lesions. No intramuscular or abdominal wall lesions are evident. IMPRESSION: 1. A cause for patient's symptoms has not been established with this study. 2. There is no appreciable renal or ureteral calculus. No hydronephrosis on either side. Urinary bladder wall thickness is within normal limits. 3. No bowel obstruction. No abscess in the abdomen or pelvis. Appendix appears normal. 4.  Hepatic steatosis. 5.  Spleen prominent without focal splenic lesion evident. Electronically Signed   By: Bretta BangWilliam  Woodruff III M.D.   On: 07/06/2019 07:35   [place] Abdomen Limited Ruq  Result Date: 07/06/2019 CLINICAL DATA:  Elevated bilirubin EXAM: ULTRASOUND ABDOMEN LIMITED RIGHT UPPER QUADRANT COMPARISON:  None. FINDINGS: Gallbladder: Gallbladder is contracted. The gallbladder wall measures 3.4 mm in thickness. Patient reports being NPO for 6 hours. No sonographic Murphy's sign. No gallstones or pericholecystic fluid. Common bile duct:  Diameter: Not visualized Liver: No focal lesion identified. Within normal limits in parenchymal echogenicity. Portal vein is patent on color Doppler imaging with normal direction of blood flow towards the liver. Other: None. IMPRESSION: 1. Contracted gallbladder with mild wall thickening. Negative sonographic Murphy's sign. No gallstones, sludge or pericholecystic fluid. 2. No intrahepatic bile duct dilatation. The common bile duct is not confidently identified. Electronically Signed   By: Signa Kellaylor  Stroud M.D.   On: 07/06/2019 10:15   IMPRESSION:  *38 year old male with 3 to 4 days of abdominal pain and nausea found to have significantly elevated LFTs in the thousands.  INR also prolonged.  Found to have acute hepatitis A.  LFTs and INR both improved this morning.  Mental status is fine.  I think that he has turned a corner and hopefully things will continue to trend down. *IV drug abuse  PLAN: -Continue to trend LFTs and INR.  Monitor mental status. -All 3 transplant centers in the area had already been contacted yesterday but do not have beds available, 3 to 4-day wait.  They said that likely due to his IV drug abuse he would not be a candidate for any type of transplant, etc. anyways.  As stated above, it looks like he is improving.  Princella PellegriniJessica D. Rafi Kenneth  07/07/2019, 9:25 AM

## 2019-07-08 LAB — CBC WITH DIFFERENTIAL/PLATELET
Abs Immature Granulocytes: 0.02 10*3/uL (ref 0.00–0.07)
Basophils Absolute: 0.1 10*3/uL (ref 0.0–0.1)
Basophils Relative: 1 %
Eosinophils Absolute: 0.1 10*3/uL (ref 0.0–0.5)
Eosinophils Relative: 3 %
HCT: 43.8 % (ref 39.0–52.0)
Hemoglobin: 14.4 g/dL (ref 13.0–17.0)
Immature Granulocytes: 1 %
Lymphocytes Relative: 57 %
Lymphs Abs: 2.4 10*3/uL (ref 0.7–4.0)
MCH: 31 pg (ref 26.0–34.0)
MCHC: 32.9 g/dL (ref 30.0–36.0)
MCV: 94.4 fL (ref 80.0–100.0)
Monocytes Absolute: 0.4 10*3/uL (ref 0.1–1.0)
Monocytes Relative: 11 %
Neutro Abs: 1.1 10*3/uL — ABNORMAL LOW (ref 1.7–7.7)
Neutrophils Relative %: 27 %
Platelets: 243 10*3/uL (ref 150–400)
RBC: 4.64 MIL/uL (ref 4.22–5.81)
RDW: 14.3 % (ref 11.5–15.5)
WBC: 4.2 10*3/uL (ref 4.0–10.5)
nRBC: 0 % (ref 0.0–0.2)

## 2019-07-08 LAB — COMPREHENSIVE METABOLIC PANEL
ALT: 2154 U/L — ABNORMAL HIGH (ref 0–44)
AST: 669 U/L — ABNORMAL HIGH (ref 15–41)
Albumin: 3 g/dL — ABNORMAL LOW (ref 3.5–5.0)
Alkaline Phosphatase: 143 U/L — ABNORMAL HIGH (ref 38–126)
Anion gap: 9 (ref 5–15)
BUN: 8 mg/dL (ref 6–20)
CO2: 23 mmol/L (ref 22–32)
Calcium: 8.2 mg/dL — ABNORMAL LOW (ref 8.9–10.3)
Chloride: 103 mmol/L (ref 98–111)
Creatinine, Ser: 0.93 mg/dL (ref 0.61–1.24)
GFR calc Af Amer: >60 mL/min (ref >60)
GFR calc non Af Amer: >60 mL/min (ref >60)
Glucose, Bld: 150 mg/dL — ABNORMAL HIGH (ref 70–99)
Potassium: 3.7 mmol/L (ref 3.5–5.1)
Sodium: 135 mmol/L (ref 135–145)
Total Bilirubin: 5.5 mg/dL — ABNORMAL HIGH (ref 0.3–1.2)
Total Protein: 6.3 g/dL — ABNORMAL LOW (ref 6.5–8.1)

## 2019-07-08 LAB — PROTIME-INR
INR: 1.1 (ref 0.8–1.2)
Prothrombin Time: 14.1 seconds (ref 11.4–15.2)

## 2019-07-08 MED ORDER — LIP MEDEX EX OINT
TOPICAL_OINTMENT | CUTANEOUS | Status: DC | PRN
  Administered 2019-07-08: 1 via TOPICAL
  Filled 2019-07-08: qty 7

## 2019-07-08 NOTE — TOC Initial Note (Signed)
Transition of Care [facility]) - Initial/Assessment Note    Patient Details  Name: Patrick Lynn MRN: [id number] Date of Birth: Feb 09, 1981  [facility] CM/SW Contact:    Trish Mage, LCSW Phone Number: 07/08/2019, 10:07 AM  Clinical Narrative:     Patient seen in response to consult re: inability to afford meds, although he is currently not being prescribed any.  Went to see him based on history of SA as well.  Mr Brum was receptive to my approach.  He is living with his father in [place], is not currently employed, and is trying to sell his home, with no success so far.  He appreciates that his father is there for him, while also acknowledging the complicated nature of their relationship.  He denies drug use for the past year, acknowledging that it was ruining his life, and states he does not feel he is at high risk for relapse currently.  States he would reach out for help if he found himself sliding.  He asked about qualifying for MCD, and I steered him towards DSS.  He is also open to a PCP referral in [place] area.  TOC will continue to follow during the course of hospitalization.               Expected Discharge Plan: Home/Self Care Barriers to Discharge: No Barriers Identified   Patient Goals and CMS Choice Patient states their goals for this hospitalization and ongoing recovery are:: "2020 has not been a good year."      Expected Discharge Plan and Services Expected Discharge Plan: Home/Self Care       Living arrangements for the past 2 months: Single Family Home                                      Prior Living Arrangements/Services Living arrangements for the past 2 months: Single Family Home Lives with:: Parents Patient language and need for interpreter reviewed:: Yes Do you feel safe going back to the place where you live?: Yes      Need for Family Participation in Patient Care: No (Comment) Care giver support system in place?: Yes (comment)    Criminal Activity/Legal Involvement Pertinent to Current Situation/Hospitalization: No - Comment as needed  Activities of Daily Living Home Assistive Devices/Equipment: None ADL Screening (condition at time of admission) Patient's cognitive ability adequate to safely complete daily activities?: Yes Is the patient deaf or have difficulty hearing?: No Does the patient have difficulty seeing, even when wearing glasses/contacts?: No Does the patient have difficulty concentrating, remembering, or making decisions?: No Patient able to express need for assistance with ADLs?: No Does the patient have difficulty dressing or bathing?: No Independently performs ADLs?: Yes (appropriate for developmental age) Does the patient have difficulty walking or climbing stairs?: No Weakness of Legs: None Weakness of Arms/Hands: None  Permission Sought/Granted                  Emotional Assessment Appearance:: Appears stated age Attitude/Demeanor/Rapport: Engaged Affect (typically observed): Appropriate Orientation: : Oriented to Self, Oriented to Place, Oriented to  Time, Oriented to Situation Alcohol / Substance Use: Not Applicable Psych Involvement: No (comment)  Admission diagnosis:  Acute hepatitis [B17.9] Nausea and vomiting in adult [R11.2] Elevated bilirubin [R17] Viral hepatitis A without hepatic coma [B15.9] Patient Active Problem List   Diagnosis Date Noted  . Viral hepatitis A without  hepatic coma   . Hepatitis A antibody positive 07/06/2019  . Abnormal liver function 07/06/2019   PCP:  Patient, No Pcp Per Pharmacy:   [id number] - [place], [place] - [address] AT [facility] [address] [place] [address] Phone: [phone] Fax: [phone]     Social Determinants of Health (SDOH) Interventions    Readmission Risk Interventions No flowsheet data found.

## 2019-07-08 NOTE — Progress Notes (Addendum)
PROGRESS NOTE  Patrick Lynn  DOB: [birth year]  PCP: Patient, No Pcp Per [id number]  DOA: 07/06/2019  LOS: 2 days   Chief Complaint  Patient presents with  . Hematuria   Brief narrative: Patrick Lynn is a 38 y.o. male with past medical history of anxiety and IV drug abuse.   Patient presented to the ED on 12/2 with complaint of abdominal pain and nausea for 3 to 4 days. Apparently, he was injecting water into his arm to help hydrate him and make him feel better. He noted that his eyes started to become yellow. In the ED, patient was afebrile and hemodynamically stable. Work-up showed significantly elevated LFTs with AST/ALT/alk phos/total bili/INR being 3448/5200/192/5 0.4/1.8 CT scan of abdomen pelvis showed hepatic steatosis. Acute hepatitis panel showed IgM positive for hepatitis A.  Patient was admitted to hospitalist medicine service for acute hepatitis A.  Subjective: Patient was seen and examined this morning.  Pleasant young Caucasian male.  Lying on bed.  Not in distress.  Feels better today.  No nausea or vomiting.  Wants to advance diet to soft today.   Assessment/Plan: Acute hepatitis A -Presented with nausea, vomiting, abdominal pain for 3 to 4 days. -Acute hepatitis panel showed IgM positive for hepatitis A. -Liver enzymes elevated as above. -Currently on conservative management. -Repeat liver enzyme test today showed gradual steady improvement. -Repeat labs tomorrow. -Diet advanced to soft for lunch today.  If able to tolerate, and if liver enzymes are improving, we will discharge him home tomorrow -Continue symptomatic management.  IV Zofran ordered.  Hyponatremia -Sodium level is low at 130 at presentation.  Likely because of poor oral intake and nausea vomiting.  Improved with IV hydration and nutrition.  History of drug abuse -Patient denies using any drugs for last several months. -On as needed Percocet for pain control.  Mobility: Encourage  ambulation Diet: Soft diet Fluid: no IV fluids DVT prophylaxis:  Encourage ambulation Code Status:  Full code Family Communication:  No one at bedside. Expected Discharge:  Home in 1 to 2 days  Consultants:  GI  Procedures:    Antimicrobials: Anti-infectives (From admission, onward)   None        Code Status: Full Code   Diet Order            DIET SOFT Room service appropriate? Yes; Fluid consistency: Thin  Diet effective now              Infusions:    Scheduled Meds: . nicotine  21 mg Transdermal Daily    PRN meds: lip balm, ondansetron, oxyCODONE-acetaminophen   Objective: Vitals:   07/08/19 0646 07/08/19 1459  BP: 109/75 111/82  Pulse: (!) 57 69  Resp: 17 20  Temp: 98 F (36.7 C) 98.2 F (36.8 C)  SpO2: 99% 98%    Intake/Output Summary (Last 24 hours) at 07/08/2019 1617 Last data filed at 07/08/2019 1521 Gross per 24 hour  Intake 1893 ml  Output -  Net 1893 ml   Filed Weights   07/06/19 0559 07/07/19 0531 07/08/19 0500  Weight: 99.8 kg 98.1 kg 98.5 kg   Weight change: 0.4 kg Body mass index is 26.43 kg/m.   Physical Exam: General exam: Appears calm and comfortable. Not in distress. Skin: No rashes, lesions or ulcers. HEENT: Atraumatic, normocephalic, supple neck, no obvious bleeding Lungs: Clear to auscultation bilaterally CVS: Regular rate and rhythm, no murmur GI/Abd soft, nontender, nondistended, bowel sound present CNS: Alert, awake monitor x3 Psychiatry: Mood appropriate  Extremities: No pedal edema, no calf tenderness  Data Review: I have personally reviewed the laboratory data and studies available.  Recent Labs  Lab 07/06/19 0630 07/07/19 0532 07/08/19 0615  WBC 4.5 3.5* 4.2  NEUTROABS 2.4  --  1.1*  HGB 16.1 14.4 14.4  HCT 47.7 43.2 43.8  MCV 90.9 94.3 94.4  PLT 244 218 243   Recent Labs  Lab 07/06/19 0630 07/07/19 0532 07/08/19 0615  NA 130* 138 135  K 4.3 3.9 3.7  CL 99 105 103  CO2 20* 24 23  GLUCOSE  128* 101* 150*  BUN _0 CREATININE 0.66 0.78 0.93  CALCIUM 8.6* 8.0* 8.2*   Recent Labs  Lab 07/06/19 0630 07/07/19 0532 07/08/19 0615  AST 3,448* 1,296* 669*  ALT 5,200* 3,329* 2,154*  ALKPHOS 192* 141* 143*  BILITOT 5.4* 4.1* 5.5*  PROT 7.9 6.0* 6.3*  ALBUMIN 3.8 2.9* 3.0*   Recent Labs  Lab 07/06/19 0836 07/07/19 1013 07/08/19 0615  INR 1.8* 1.4* 1.1    Terrilee Croak, MD  [facility] 07/08/2019

## 2019-07-09 LAB — CBC WITH DIFFERENTIAL/PLATELET
Abs Immature Granulocytes: 0.01 10*3/uL (ref 0.00–0.07)
Basophils Absolute: 0 10*3/uL (ref 0.0–0.1)
Basophils Relative: 1 %
Eosinophils Absolute: 0.1 10*3/uL (ref 0.0–0.5)
Eosinophils Relative: 3 %
HCT: 45.3 % (ref 39.0–52.0)
Hemoglobin: 14.8 g/dL (ref 13.0–17.0)
Immature Granulocytes: 0 %
Lymphocytes Relative: 54 %
Lymphs Abs: 2.5 10*3/uL (ref 0.7–4.0)
MCH: 30.7 pg (ref 26.0–34.0)
MCHC: 32.7 g/dL (ref 30.0–36.0)
MCV: 94 fL (ref 80.0–100.0)
Monocytes Absolute: 0.5 10*3/uL (ref 0.1–1.0)
Monocytes Relative: 11 %
Neutro Abs: 1.4 10*3/uL — ABNORMAL LOW (ref 1.7–7.7)
Neutrophils Relative %: 31 %
Platelets: 295 10*3/uL (ref 150–400)
RBC: 4.82 MIL/uL (ref 4.22–5.81)
RDW: 14.3 % (ref 11.5–15.5)
WBC: 4.6 10*3/uL (ref 4.0–10.5)
nRBC: 0 % (ref 0.0–0.2)

## 2019-07-09 LAB — COMPREHENSIVE METABOLIC PANEL
ALT: 1654 U/L — ABNORMAL HIGH (ref 0–44)
AST: 356 U/L — ABNORMAL HIGH (ref 15–41)
Albumin: 3.2 g/dL — ABNORMAL LOW (ref 3.5–5.0)
Alkaline Phosphatase: 162 U/L — ABNORMAL HIGH (ref 38–126)
Anion gap: 9 (ref 5–15)
BUN: 7 mg/dL (ref 6–20)
CO2: 25 mmol/L (ref 22–32)
Calcium: 8.6 mg/dL — ABNORMAL LOW (ref 8.9–10.3)
Chloride: 103 mmol/L (ref 98–111)
Creatinine, Ser: 0.76 mg/dL (ref 0.61–1.24)
GFR calc Af Amer: >60 mL/min (ref >60)
GFR calc non Af Amer: >60 mL/min (ref >60)
Glucose, Bld: 92 mg/dL (ref 70–99)
Potassium: 4.2 mmol/L (ref 3.5–5.1)
Sodium: 137 mmol/L (ref 135–145)
Total Bilirubin: 7.1 mg/dL — ABNORMAL HIGH (ref 0.3–1.2)
Total Protein: 6.5 g/dL (ref 6.5–8.1)

## 2019-07-09 LAB — PROTIME-INR
INR: 1 (ref 0.8–1.2)
Prothrombin Time: 13.5 seconds (ref 11.4–15.2)

## 2019-07-09 MED ORDER — TRAMADOL HCL 50 MG PO TABS: 50.0000 mg | ORAL_TABLET | Freq: Three times a day (TID) | ORAL | 0 refills | Status: AC | PRN

## 2019-07-09 MED ORDER — ONDANSETRON HCL 4 MG PO TABS: 4.0000 mg | ORAL_TABLET | Freq: Three times a day (TID) | ORAL | 0 refills | Status: AC | PRN

## 2019-07-09 NOTE — Discharge Summary (Signed)
Physician Discharge Summary  Patrick Lynn MOL:[id number] DOB: 1981-01-25 DOA: 07/06/2019  PCP: Patient, No Pcp Per  Admit date: 07/06/2019 Discharge date: 07/09/2019  Admitted From: Home Discharge disposition: Home   Code Status: Full Code  Diet Recommendation: Soft diet.  Advance in next 1 to 2 days as tolerated   Recommendations for Outpatient Follow-Up:   1. Follow-up with PCP as outpatient 2. Follow-up with GI as an outpatient to repeat liver function test.  Discharge Diagnosis:   Active Problems:   Hepatitis A antibody positive   Abnormal liver function   Viral hepatitis A without hepatic coma   History of Present Illness / Brief narrative:  Patrick Robertsis a 38 y.o.malewith past medical history of anxiety and IV drug abuse.  Patient presented to the ED on 12/2 with complaint of abdominal pain and nausea for 3 to 4 days. Apparently, he was injecting water into his arm to help hydrate him and make him feel better. He noted that his eyes started to become yellow. In the ED, patient was afebrile and hemodynamically stable. Work-up showed significantly elevated LFTs with AST/ALT/alk phos/total bili/INR being 3448/5200/192/5 0.4/1.8 CT scan of abdomen pelvis showed hepatic steatosis. Acute hepatitis panel showed IgM positive for hepatitis A.  Patient was admitted to hospitalist medicine service for acute hepatitis A.  Hospital Course:  Acute hepatitis A -Presented with nausea, vomiting, abdominal pain for 3 to 4 days. -Acute hepatitis panel showed IgM positive for hepatitis A. -Liver enzymes elevated as above. -Currently on conservative management. -Liver enzyme levels subsequently improving.  AST/ALT/alk phos/total bili at 356/1654/162/7.1. Overall improving pattern but not yet back to normal.  I offered patient to stay another night and repeat blood work tomorrow because he does not have a primary care provider to follow-up.  Patient states that he wants to go  home and will find a primary care provider to follow-up.  I recommended him to come to the ED if his symptoms are to worsen. -Able to tolerate soft diet.  Recommend to stay on soft diet for next 1 to 2 days and then advance as tolerated. -Prescription for oral Zofran given for nausea.  Hyponatremia -Sodium level is low at 130 at presentation.  Likely because of poor oral intake and nausea vomiting.  Improved with IV hydration and nutrition.  History of drug abuse -Patient denies using any drugs for last several months. -On as needed Percocet for pain control.  Stable for discharge to home today.  Subjective:  Seen and examined this morning.  Pleasant male Caucasian male.  Not in distress.  Feels better today.  Able to tolerate soft diet.  Discharge Exam:   Vitals:   07/08/19 1459 07/08/19 2015 07/09/19 0426 07/09/19 0500  BP: 111/82 101/63 110/78   Pulse: 69 63 74   Resp: '20 18 18   '$ Temp: 98.2 F (36.8 C) 99.3 F (37.4 C) 98 F (36.7 C)   TempSrc: Oral Oral Oral   SpO2: 98% 98% 99%   Weight:    97.3 kg  Height:        Body mass index is 26.11 kg/m.  General exam: Appears calm and comfortable.  Skin: No rashes, lesions or ulcers. HEENT: Atraumatic, normocephalic, supple neck, no obvious bleeding Lungs: Clear to auscultation bilaterally CVS: Regular rate and rhythm, no murmur GI/Abd soft, nontender, nondistended, bowel sound present CNS: Alert, awake, oriented x3 Psychiatry: Mood appropriate Extremities: No pedal edema, no calf tenderness  Discharge Instructions:  Wound care: None Discharge Instructions  Diet general   Complete by: As directed    Soft diet for next 1-2 days then advance as tolerated.   Increase activity slowly   Complete by: As directed      Follow-up Information    [facility] Follow up.   Why: This clinic will see you for free if you desire follow up with a primary care Dr.  I was not able to get you an appointment as they  are not open on Fridays. Contact information: Address: [address] [place], [place] [address] Phone: [phone]       [facility] Follow up.   Why: They offer comprehensive services if the need arises, including individual and group support and replacement medications Contact information: [address] [place], [place] [address]  [address] [place] [address] Office: [phone]  Fax: [phone]        Armbruster, Carlota Raspberry, MD Follow up.   Specialty: Gastroenterology Contact information: [address] [place] [address] [phone]          Allergies as of 07/09/2019      Reactions   Bee Venom Anaphylaxis      Medication List    TAKE these medications   ondansetron 4 MG tablet Commonly known as: Zofran Take 1 tablet (4 mg total) by mouth every 8 (eight) hours as needed for up to 10 days for nausea or vomiting.   traMADol 50 MG tablet Commonly known as: Ultram Take 1 tablet (50 mg total) by mouth every 8 (eight) hours as needed for up to 5 days.       Time coordinating discharge: 35 minutes  The results of significant diagnostics from this hospitalization (including imaging, microbiology, ancillary and laboratory) are listed below for reference.    Procedures and Diagnostic Studies:   Ct Renal Stone Study  Result Date: 07/06/2019 CLINICAL DATA:  Flank pain and hematuria EXAM: CT ABDOMEN AND PELVIS WITHOUT CONTRAST TECHNIQUE: Multidetector CT imaging of the abdomen and pelvis was performed following the standard protocol without oral or IV contrast. COMPARISON:  June 22, 2018 FINDINGS: Lower chest: Lung bases are clear. Hepatobiliary: There is hepatic steatosis. No focal liver lesions are evident on this noncontrast enhanced study. The gallbladder wall is not appreciably thickened. There is no biliary duct dilatation. Pancreas: There is no pancreatic mass or inflammatory focus. Spleen: Spleen measures 13.6 x 11.9 x  5.8 cm with a measures splenic volume of 469 cubic cm. No focal splenic lesions are evident. Adrenals/Urinary Tract: Adrenals bilaterally appear normal. Kidneys bilaterally show no evident mass or hydronephrosis on either side. There is no renal or ureteral calculus appreciable on either side. Urinary bladder is midline with wall thickness within normal limits. Stomach/Bowel: There is no appreciable bowel wall or mesenteric thickening. No evident bowel obstruction. Terminal ileum appears normal. There is no evident free air or portal venous air. Vascular/Lymphatic: There is no abdominal aortic aneurysm. No vascular lesions are demonstrable on this noncontrast enhanced study. There is no appreciable adenopathy in the abdomen or pelvis. Reproductive: Prostate and seminal vesicles are normal in size and contour. No pelvic masses are evident. Other: Appendix appears normal. There is no evident abscess or ascites in the abdomen or pelvis. Musculoskeletal: There are no blastic or lytic bone lesions. No intramuscular or abdominal wall lesions are evident. IMPRESSION: 1. A cause for patient's symptoms has not been established with this study. 2. There is no appreciable  renal or ureteral calculus. No hydronephrosis on either side. Urinary bladder wall thickness is within normal limits. 3. No bowel obstruction. No abscess in the abdomen or pelvis. Appendix appears normal. 4.  Hepatic steatosis. 5.  Spleen prominent without focal splenic lesion evident. Electronically Signed   By: Lowella Grip III M.D.   On: 07/06/2019 07:35   US Abdomen Limited Ruq  Result Date: 07/06/2019 CLINICAL DATA:  Elevated bilirubin EXAM: ULTRASOUND ABDOMEN LIMITED RIGHT UPPER QUADRANT COMPARISON:  None. FINDINGS: Gallbladder: Gallbladder is contracted. The gallbladder wall measures 3.4 mm in thickness. Patient reports being NPO for 6 hours. No sonographic Murphy's sign. No gallstones or pericholecystic fluid. Common bile duct: Diameter: Not  visualized Liver: No focal lesion identified. Within normal limits in parenchymal echogenicity. Portal vein is patent on color Doppler imaging with normal direction of blood flow towards the liver. Other: None. IMPRESSION: 1. Contracted gallbladder with mild wall thickening. Negative sonographic Murphy's sign. No gallstones, sludge or pericholecystic fluid. 2. No intrahepatic bile duct dilatation. The common bile duct is not confidently identified. Electronically Signed   By: Kerby Moors M.D.   On: 07/06/2019 10:15     Labs:   Basic Metabolic Panel: Recent Labs  Lab 07/06/19 0630 07/07/19 0532 07/08/19 0615 07/09/19 0534  NA 130* 138 135 137  K 4.3 3.9 3.7 4.2  CL 99 105 103 103  CO2 20* '24 23 25  '$ GLUCOSE 128* 101* 150* 92  BUN '12 10 8 7  '$ CREATININE 0.66 0.78 0.93 0.76  CALCIUM 8.6* 8.0* 8.2* 8.6*   GFR Estimated Creatinine Clearance: 150.7 mL/min (by C-G formula based on SCr of 0.76 mg/dL). Liver Function Tests: Recent Labs  Lab 07/06/19 0630 07/07/19 0532 07/08/19 0615 07/09/19 0534  AST 3,448* 1,296* 669* 356*  ALT 5,200* 3,329* 2,154* 1,654*  ALKPHOS 192* 141* 143* 162*  BILITOT 5.4* 4.1* 5.5* 7.1*  PROT 7.9 6.0* 6.3* 6.5  ALBUMIN 3.8 2.9* 3.0* 3.2*   Recent Labs  Lab 07/06/19 0630  LIPASE 51   No results for input(s): AMMONIA in the last 168 hours. Coagulation profile Recent Labs  Lab 07/06/19 0836 07/07/19 1013 07/08/19 0615 07/09/19 0534  INR 1.8* 1.4* 1.1 1.0    CBC: Recent Labs  Lab 07/06/19 0630 07/07/19 0532 07/08/19 0615 07/09/19 0534  WBC 4.5 3.5* 4.2 4.6  NEUTROABS 2.4  --  1.1* 1.4*  HGB 16.1 14.4 14.4 14.8  HCT 47.7 43.2 43.8 45.3  MCV 90.9 94.3 94.4 94.0  PLT 244 218 243 295   Cardiac Enzymes: Recent Labs  Lab 07/06/19 2031  CKTOTAL 43*  CKMB 1.6   BNP: Invalid input(s): POCBNP CBG: Recent Labs  Lab 07/06/19 2347  GLUCAP 118*   D-Dimer No results for input(s): DDIMER in the last 72 hours. Hgb A1c No results for  input(s): HGBA1C in the last 72 hours. Lipid Profile No results for input(s): CHOL, HDL, LDLCALC, TRIG, CHOLHDL, LDLDIRECT in the last 72 hours. Thyroid function studies Recent Labs    07/06/19 2031  TSH 0.909   Anemia work up No results for input(s): VITAMINB12, FOLATE, FERRITIN, TIBC, IRON, RETICCTPCT in the last 72 hours. Microbiology Recent Results (from the past 240 hour(s))  Urine culture     Status: None   Collection Time: 07/06/19  7:28 AM   Specimen: Urine, Clean Catch  Result Value Ref Range Status   Specimen Description   Final    URINE, CLEAN CATCH Performed at [facility], [place], [address] [place], [place] [address]    Special Requests   Final    NONE Performed at [facility], [address] [place], [place] [address]    Culture   Final    NO GROWTH Performed at [facility], [place] [address], [place], [place] [address]    Report Status 07/07/2019 FINAL  Final  SARS Coronavirus 2 Ag (30 min TAT) - Nasal Swab (BD Veritor Kit)     Status: None   Collection Time: 07/06/19  8:36 AM   Specimen: Nasal Swab (BD Veritor Kit)  Result Value Ref Range Status   SARS Coronavirus 2 Ag NEGATIVE NEGATIVE Final    Comment: (NOTE) SARS-CoV-2 antigen NOT DETECTED.  Negative results are presumptive.  Negative results do not preclude SARS-CoV-2 infection and should not be used as the sole basis for treatment or other patient management decisions, including infection  control decisions, particularly in the presence of clinical signs and  symptoms consistent with COVID-19, or in those who have been in contact with the virus.  Negative results must be combined with clinical observations, patient history, and epidemiological information. The expected result is Negative. Fact Sheet for Patients: PodPark.tn Fact Sheet for Healthcare Providers: GiftContent.is This test is not yet approved or cleared  by the [place] FDA and  has been authorized for detection and/or diagnosis of SARS-CoV-2 by FDA under an Emergency Use Authorization (EUA).  This EUA will remain in effect (meaning this test can be used) for the duration of  the COVID-19 de claration under Section 564(b)(1) of the Act, 21 U.S.C. section 360bbb-3(b)(1), unless the authorization is terminated or revoked sooner. Performed at [facility], [address] [place], [place] [address]   SARS CORONAVIRUS 2 (TAT 6-24 HRS) Nasopharyngeal Nasopharyngeal Swab     Status: None   Collection Time: 07/06/19  9:51 AM   Specimen: Nasopharyngeal Swab  Result Value Ref Range Status   SARS Coronavirus 2 NEGATIVE NEGATIVE Final    Comment: (NOTE) SARS-CoV-2 target nucleic acids are NOT DETECTED. The SARS-CoV-2 RNA is generally detectable in upper and lower respiratory specimens during the acute phase of infection. Negative results do not preclude SARS-CoV-2 infection, do not rule out co-infections with other pathogens, and should not be used as the sole basis for treatment or other patient management decisions. Negative results must be combined with clinical observations, patient history, and epidemiological information. The expected result is Negative. Fact Sheet for Patients: SugarRoll.be Fact Sheet for Healthcare Providers: https://www.woods-mathews.com/ This test is not yet approved or cleared by the [place] FDA and  has been authorized for detection and/or diagnosis of SARS-CoV-2 by FDA under an Emergency Use Authorization (EUA). This EUA will remain  in effect (meaning this test can be used) for the duration of the COVID-19 declaration under Section 56 4(b)(1) of the Act, 21 U.S.C. section 360bbb-3(b)(1), unless the authorization is terminated or revoked sooner. Performed at  [facility], [place] [address], [place], [place] [address]     Please note: You  were cared for by a hospitalist during your hospital stay. Once you are discharged, your primary care physician will handle any further medical issues. Please note that NO REFILLS for any discharge medications will be authorized once you are discharged, as it is imperative that you return to your primary care physician (or establish a relationship with a primary care physician if you do not have one) for your post hospital discharge needs so that they  can reassess your need for medications and monitor your lab values.  Signed: Terrilee Croak  [facility] 07/09/2019, 2:58 PM

## 2019-07-09 NOTE — Progress Notes (Signed)
Went over discharge instructions with the patient. He verbalized and agreed with the plan.

## 2019-07-15 ENCOUNTER — Telehealth: Payer: Self-pay | Admitting: Gastroenterology

## 2019-07-15 NOTE — Telephone Encounter (Signed)
Called patient back and scheduled him with Alonza Bogus PA on 07/21/19

## 2019-07-21 ENCOUNTER — Other Ambulatory Visit (INDEPENDENT_AMBULATORY_CARE_PROVIDER_SITE_OTHER): Payer: Self-pay

## 2019-07-21 ENCOUNTER — Encounter: Payer: Self-pay | Admitting: Gastroenterology

## 2019-07-21 ENCOUNTER — Ambulatory Visit (INDEPENDENT_AMBULATORY_CARE_PROVIDER_SITE_OTHER): Payer: Self-pay | Admitting: Gastroenterology

## 2019-07-21 VITALS — BP 100/70 | HR 88 | Temp 97.7°F | Ht 76.0 in | Wt 223.1 lb

## 2019-07-21 DIAGNOSIS — B159 Hepatitis A without hepatic coma: Secondary | ICD-10-CM

## 2019-07-21 DIAGNOSIS — R7989 Other specified abnormal findings of blood chemistry: Secondary | ICD-10-CM

## 2019-07-21 DIAGNOSIS — K5909 Other constipation: Secondary | ICD-10-CM | POA: Insufficient documentation

## 2019-07-21 LAB — COMPREHENSIVE METABOLIC PANEL
ALT: 294 U/L — ABNORMAL HIGH (ref 0–53)
AST: 137 U/L — ABNORMAL HIGH (ref 0–37)
Albumin: 3.9 g/dL (ref 3.5–5.2)
Alkaline Phosphatase: 227 U/L — ABNORMAL HIGH (ref 39–117)
BUN: 10 mg/dL (ref 6–23)
CO2: 32 mEq/L (ref 19–32)
Calcium: 10 mg/dL (ref 8.4–10.5)
Chloride: 99 mEq/L (ref 96–112)
Creatinine, Ser: 0.85 mg/dL (ref 0.40–1.50)
GFR: 99.75 mL/min (ref >60.00)
Glucose, Bld: 92 mg/dL (ref 70–99)
Potassium: 4.5 mEq/L (ref 3.5–5.1)
Sodium: 136 mEq/L (ref 135–145)
Total Bilirubin: 2.5 mg/dL — ABNORMAL HIGH (ref 0.2–1.2)
Total Protein: 7.5 g/dL (ref 6.0–8.3)

## 2019-07-21 LAB — CBC WITH DIFFERENTIAL/PLATELET
Basophils Absolute: 0 10*3/uL (ref 0.0–0.1)
Basophils Relative: 1.1 % (ref 0.0–3.0)
Eosinophils Absolute: 0.2 10*3/uL (ref 0.0–0.7)
Eosinophils Relative: 3.5 % (ref 0.0–5.0)
HCT: 41.8 % (ref 39.0–52.0)
Hemoglobin: 14.5 g/dL (ref 13.0–17.0)
Lymphocytes Relative: 48.9 % — ABNORMAL HIGH (ref 12.0–46.0)
Lymphs Abs: 2.2 10*3/uL (ref 0.7–4.0)
MCHC: 34.7 g/dL (ref 30.0–36.0)
MCV: 92.3 fl (ref 78.0–100.0)
Monocytes Absolute: 0.6 10*3/uL (ref 0.1–1.0)
Monocytes Relative: 13.9 % — ABNORMAL HIGH (ref 3.0–12.0)
Neutro Abs: 1.4 10*3/uL (ref 1.4–7.7)
Neutrophils Relative %: 32.6 % — ABNORMAL LOW (ref 43.0–77.0)
Platelets: 350 10*3/uL (ref 150.0–400.0)
RBC: 4.53 Mil/uL (ref 4.22–5.81)
RDW: 14.6 % (ref 11.5–15.5)
WBC: 4.4 10*3/uL (ref 4.0–10.5)

## 2019-07-21 LAB — PROTIME-INR
INR: 1 ratio (ref 0.8–1.0)
Prothrombin Time: 11.6 s (ref 9.6–13.1)

## 2019-07-21 MED ORDER — ACYCLOVIR 400 MG PO TABS: 400.0000 mg | ORAL_TABLET | ORAL | 0 refills | Status: AC | PRN

## 2019-07-21 NOTE — Patient Instructions (Addendum)
If you are age 40 or older, your body mass index should be between 23-30. Your Body mass index is 27.16 kg/m. If this is out of the aforementioned range listed, please consider follow up with your Primary Care Provider.  If you are age 22 or younger, your body mass index should be between 19-25. Your Body mass index is 27.16 kg/m. If this is out of the aformentioned range listed, please consider follow up with your Primary Care Provider.   Your provider has requested that you go to the basement level for lab work before leaving today. Press "B" on the elevator. The lab is located at the first door on the left as you exit the elevator.   We have sent the following medications to your pharmacy for you to pick up at your convenience: Acyclovir as needed.    Follow up as needed.

## 2019-07-21 NOTE — Progress Notes (Signed)
07/21/2019 Patrick Lynn [id number] May 05, 1981   HISTORY OF PRESENT ILLNESS: This is a 38 year old male who is here for hospital follow-up of hepatitis A infection.  Had LFTs in the thousands.  INR was initially 1.8, but rapidly recovered the next day to 1.4.  His mental status remained normal.  Also, LFTs had a good downtrend day following diagnosis.  He feels well.  He had still been having some nausea up until recently.  No abdominal pain.  Admits to some constipation that began around the time of his hepatitis A diagnosis.  He says he is trying to establish care with a new PCP.  Is asking for a small supply of his acyclovir prescription.   Past Medical History:  Diagnosis Date  . Abnormal liver function 07/06/2019   Acute Hepatitis A  . Anxiety   . Hepatitis A    Past Surgical History:  Procedure Laterality Date  . VARICOCELECTOMY     abdominal  . WISDOM TOOTH EXTRACTION      reports that he has been smoking cigarettes. He has a 12.00 pack-year smoking history. He has never used smokeless tobacco. He reports current alcohol use. He reports that he does not use drugs. family history includes COPD in his father, maternal grandfather, and mother; Heart disease in his paternal grandfather; Kidney disease in his paternal grandmother; Lung disease in his mother. Allergies  Allergen Reactions  . Bee Venom Anaphylaxis      Outpatient Encounter Medications as of 07/21/2019  Medication Sig  . acyclovir (ZOVIRAX) 400 MG tablet Take 400 mg by mouth as needed.  . ALPRAZolam (XANAX) 0.5 MG tablet Take 0.5 mg by mouth at bedtime as needed for anxiety.  . ondansetron (ZOFRAN) 4 MG tablet Take 4 mg by mouth every 8 (eight) hours as needed for nausea or vomiting.  . traMADol (ULTRAM) 50 MG tablet Take 50 mg by mouth every 6 (six) hours as needed.   No facility-administered encounter medications on file as of 07/21/2019.     REVIEW OF SYSTEMS  : All other systems reviewed and  negative except where noted in the History of Present Illness.   PHYSICAL EXAM: BP 100/70 (BP Location: Left Arm, Patient Position: Sitting, Cuff Size: Normal)   Pulse 88   Temp 97.7 F (36.5 C)   Ht 6\' 4"  (1.93 m) Comment: height measured without shoes  Wt 223 lb 2 oz (101.2 kg)   BMI 27.16 kg/m  General: Well developed white male in no acute distress Head: Normocephalic and atraumatic Eyes:  Sclerae anicteric, conjunctiva pink. Ears: Normal auditory acuity Lungs: Clear throughout to auscultation; no increased WOB. Heart: Regular rate and rhythm; no M/R/G. Abdomen: Soft, non-distended.  BS present.  Non-tender. Musculoskeletal: Symmetrical with no gross deformities  Skin: No lesions on visible extremities Extremities: No edema  Neurological: Alert oriented x 4, grossly non-focal Psychological:  Alert and cooperative. Normal mood and affect  ASSESSMENT AND PLAN: *Hep A: Hospital follow-up for recent hepatitis A with significant LFT elevation.  Feeling well.  We will repeat LFTs today along with CBC, BMP, PT/INR. *Herpes infection: Patient asked for a small supply of his acyclovir while he is trying to establish with a new primary care physician.  Will renew prescription enough for 2 weeks.  Advised that I will give no further refills on this medication. *Constipation: Likely due to the pain medications that he had been using.  No longer using those at this point.  I suspect that this will  resolve, but he can certainly use some MiraLAX daily for now.  Advised to drink plenty of fluids and have good fiber intake.   CC:  No ref. provider found

## 2019-07-22 NOTE — Progress Notes (Signed)
Agree with assessment and plan as outlined. LFTs have significantly improved which is excellent news. I would have him repeat LFTs in 6 weeks or so to assess for normalization. I would also add hep B surface antibody lab at that time to assess immunity given history of IV drug use, if he is not immune would recommend vaccination given high risk behaviors. Thanks

## 2019-07-26 ENCOUNTER — Telehealth: Payer: Self-pay

## 2019-07-26 ENCOUNTER — Telehealth: Payer: Self-pay | Admitting: Gastroenterology

## 2019-07-26 ENCOUNTER — Other Ambulatory Visit: Payer: Self-pay

## 2019-07-26 DIAGNOSIS — R7989 Other specified abnormal findings of blood chemistry: Secondary | ICD-10-CM

## 2019-07-26 NOTE — Progress Notes (Signed)
Please let the patient know that I would like him to come back and have his LFT's repeat for [place] in 6 weeks just so that we can get the results here and be sure they have completely returned to normal.  Also put in for a Hep B surface Ab to be drawn at that time as well.  Thank you,  Jess

## 2019-07-26 NOTE — Telephone Encounter (Signed)
Author: Loralie Champagne, PA-C Service: Gastroenterology Author Type: Physician Assistant  Filed: 07/26/2019 10:41 AM Encounter Date: 07/21/2019 Status: Signed  Editor: Zehr, Laban Emperor, PA-C (Physician Assistant)     Show:Clear all [x] Manual[] Template[] Copied  Added by: [x] Zehr, Jessica D, PA-C  [] Hover for details Please let the patient know that I would like him to come back and have his LFT's repeat for Lavinia Sharps in 6 weeks just so that we can get the results here and be sure they have completely returned to normal.  Also put in for a Hep B surface Ab to be drawn at that time as well.  Thank you,  Jess      Unable to reach pt by phone. Order and reminder in epic for additional labs.

## 2019-07-26 NOTE — Telephone Encounter (Signed)
-----   Message from Loralie Champagne, PA-C sent at 07/26/2019 10:40 AM EST -----   ----- Message ----- From: Yetta Flock, MD Sent: 07/22/2019   7:30 AM EST To: Loralie Champagne, PA-C    ----- Message ----- From: Loralie Champagne, PA-C Sent: 07/21/2019   4:30 PM EST To: Yetta Flock, MD

## 2019-07-26 NOTE — Telephone Encounter (Signed)
Attempted to return pts call and got message that mailbox is full and cannot accept messages again.

## 2019-08-01 ENCOUNTER — Telehealth: Payer: Self-pay

## 2019-08-01 DIAGNOSIS — R7989 Other specified abnormal findings of blood chemistry: Secondary | ICD-10-CM

## 2019-08-01 NOTE — Telephone Encounter (Signed)
-----   Message from Loralie Champagne, PA-C sent at 07/26/2019 10:42 AM EST ----- Please let the patient know that I would like him to come back and have his LFT's repeat for [place] in 6 weeks just so that we can get the results here and be sure they have completely returned to normal.  Also put in for a Hep B surface Ab to be drawn at that time as well.  Thank you,  Jess

## 2019-08-01 NOTE — Telephone Encounter (Signed)
Lab order has been entered and pt will be called and reminded in 6 weeks.

## 2019-09-05 DEATH — deceased

## 2019-09-29 IMAGING — CR DG LUMBAR SPINE COMPLETE 4+V
5 series · 5 of 5 positions shown · non-contrast
Comparison: Reformats from abdominopelvic CT 06/22/2018

CLINICAL DATA: Thoracic back pain radiating to the low back for 1
month.

EXAM:
LUMBAR SPINE - COMPLETE 4+ VIEW

[t lumbar spine ap]
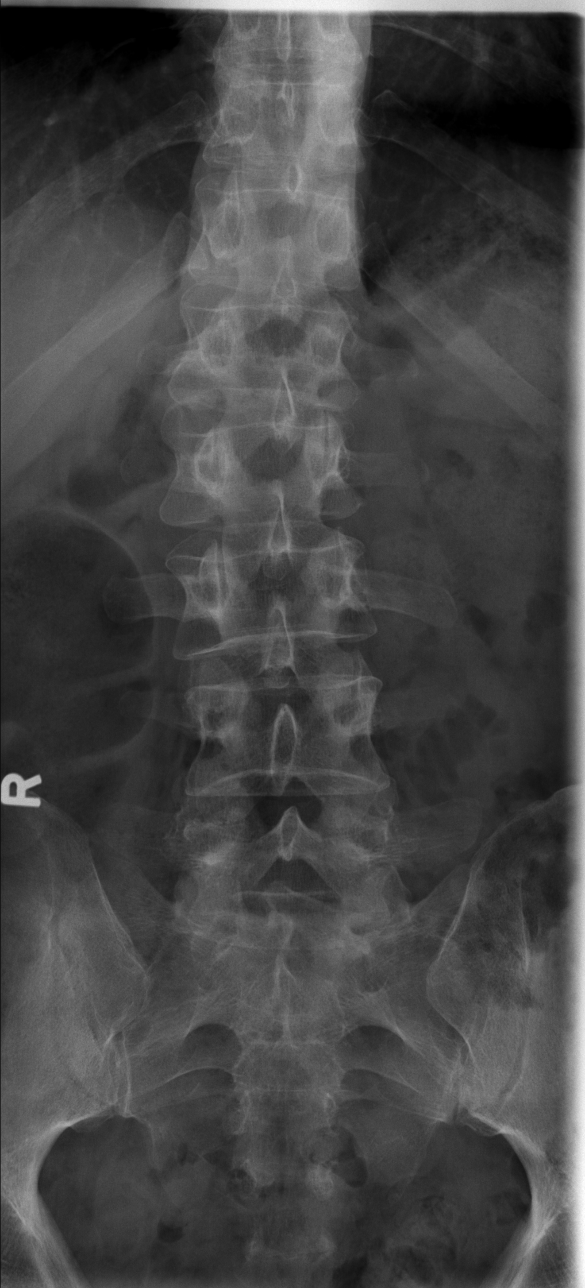

[t lumbar spine obl (1 of 2)]
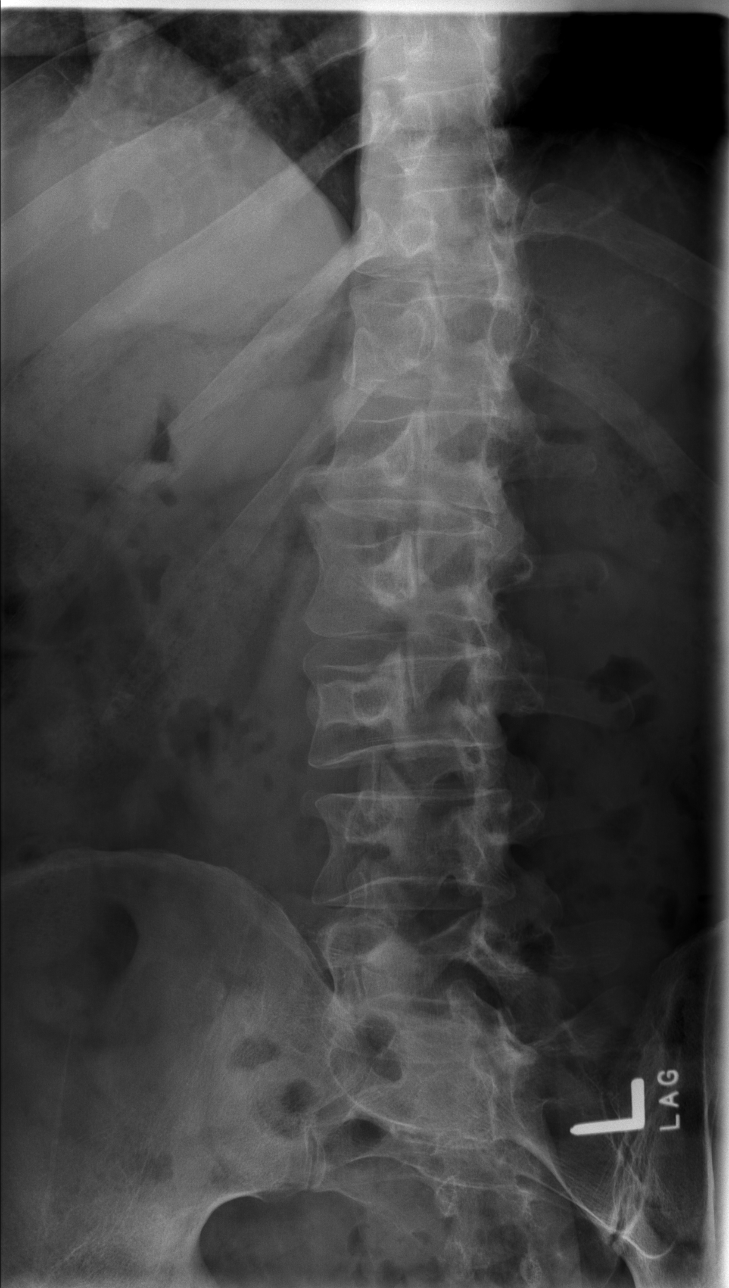

[t lumbar spine obl (2 of 2)]
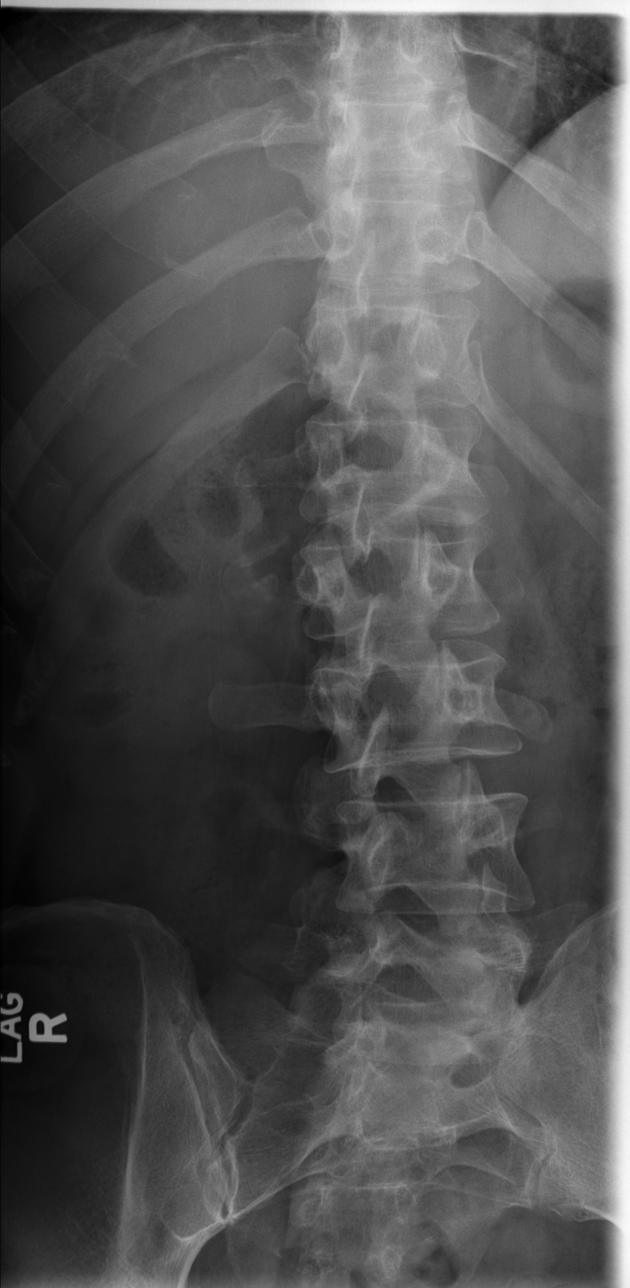

[t lumbar spine lat]
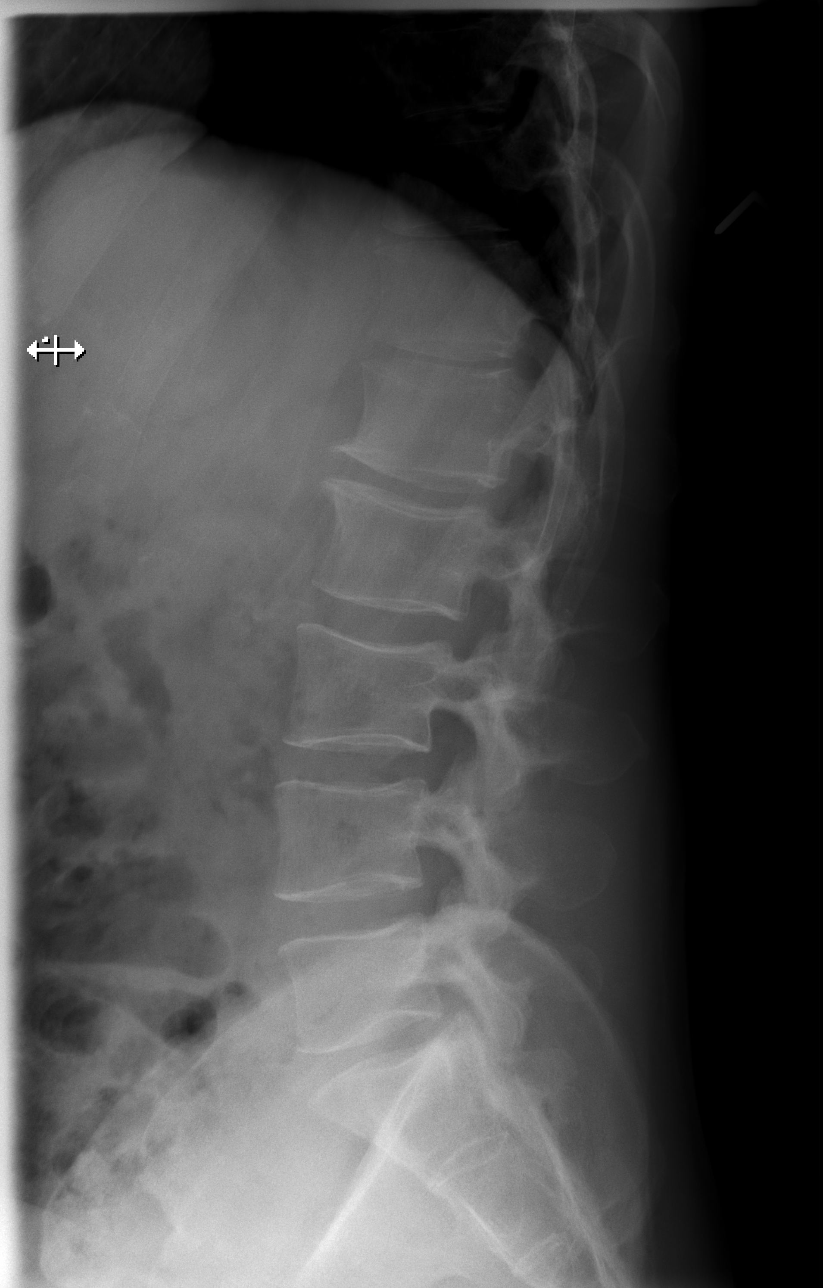

[t lumbar l-5 s-1 spot]
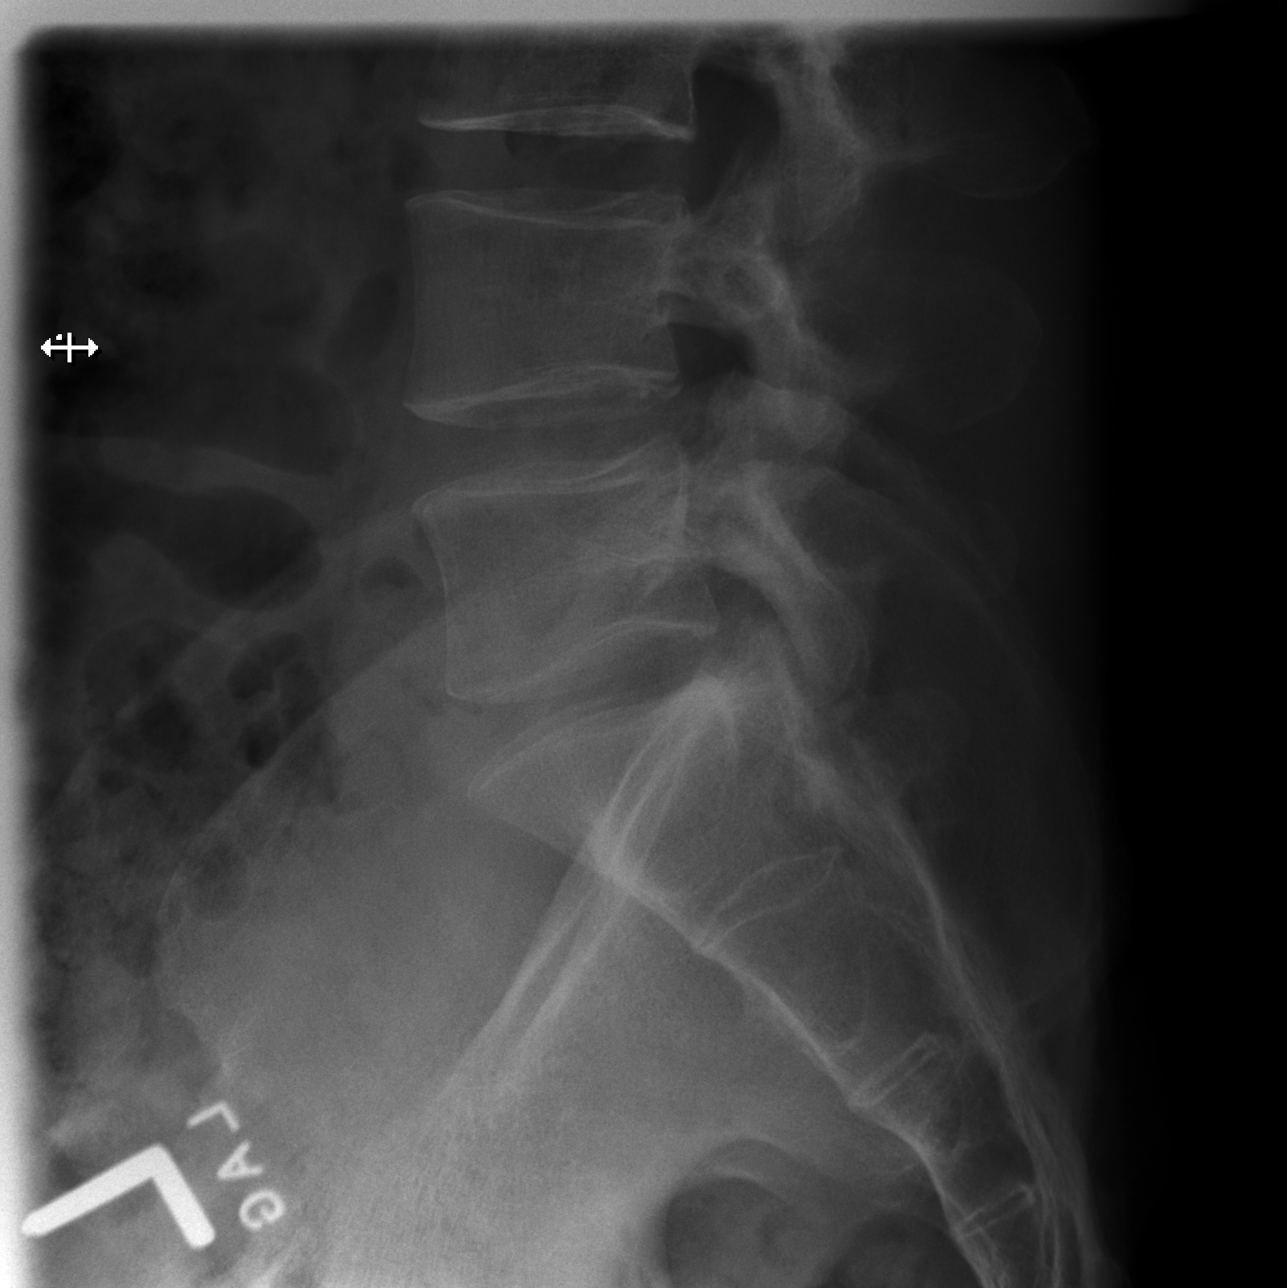

[5 of 5 positions shown; findings below may reference images not displayed]

FINDINGS: The alignment is maintained. Vertebral body heights are normal.
There is no listhesis. The posterior elements are intact. Disc space
narrowing and endplate spurring at L1-L2. No fracture. Sacroiliac
joints are symmetric and normal.
IMPRESSION: Mild degenerative disc disease at L1-L2. No acute bony abnormality.

## 2019-09-29 IMAGING — MR MR LUMBAR SPINE W/O CM
5 of 12 series · 19 of 48 positions shown · non-contrast
Comparison: Radiographs of the thoracic and lumbar spine dated
06/30/2018

CLINICAL DATA: Head and lower back pain and chest pain. IV drug
abuse.

EXAM:
MRI THORACIC AND LUMBAR SPINE WITHOUT CONTRAST
TECHNIQUE: Multiplanar and multiecho pulse sequences of the thoracic and lumbar
spine were obtained without intravenous contrast.

[Series 7: T1 · sagittal · 3.0mm · 0.66mm/px · 2 of 14 slices shown]
[im 1/14]
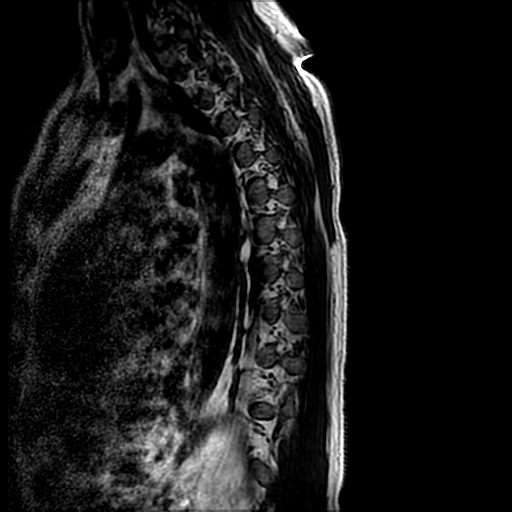
[im 14/14]
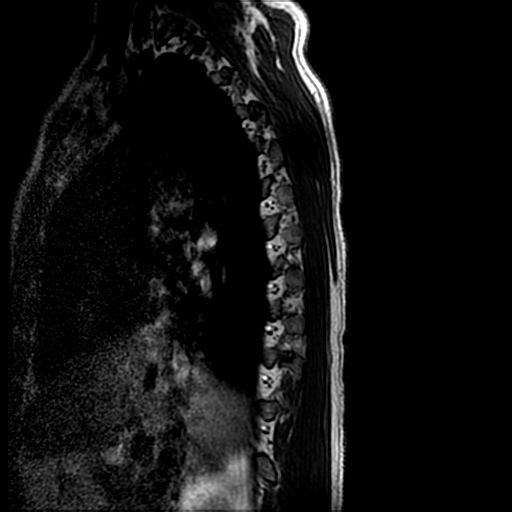

[Series 9: T2 post-contrast · sagittal · 3.0mm · 0.66mm/px · 2 of 14 slices shown (1 of 2)]
[im 1/14]
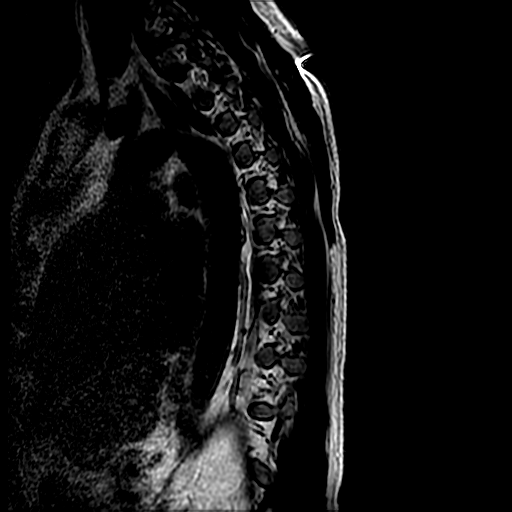
[im 14/14]
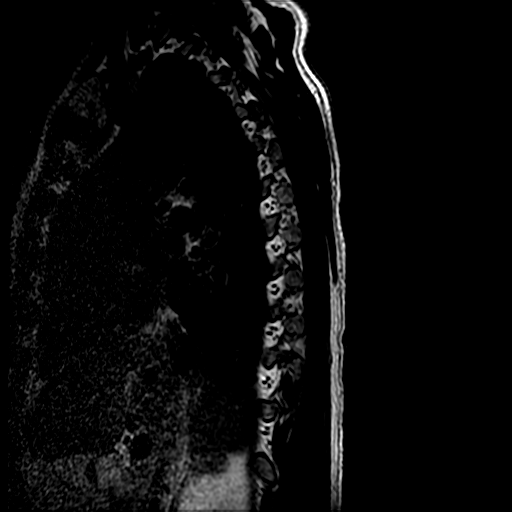

[Series 10: T2 · axial · 4.0mm · 0.41mm/px · z∈[-311,-53]mm · 7 of 42 slices shown (1 of 2)]
[im 1/42]
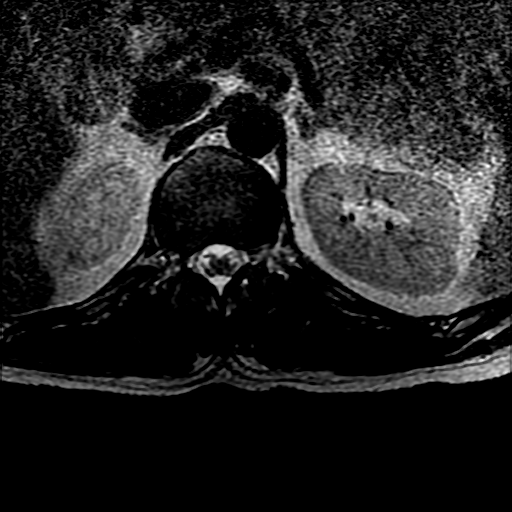
[im 7/42]
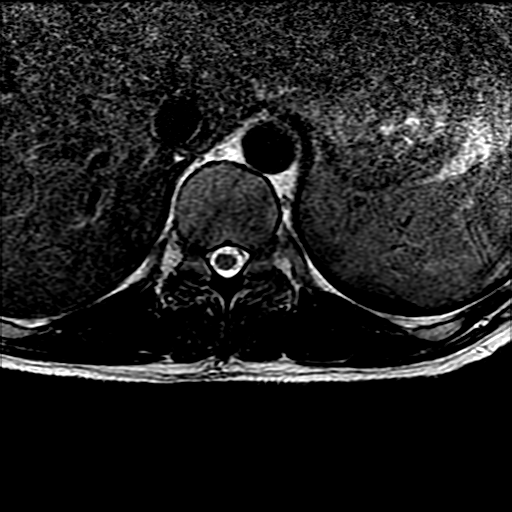
[im 14/42]
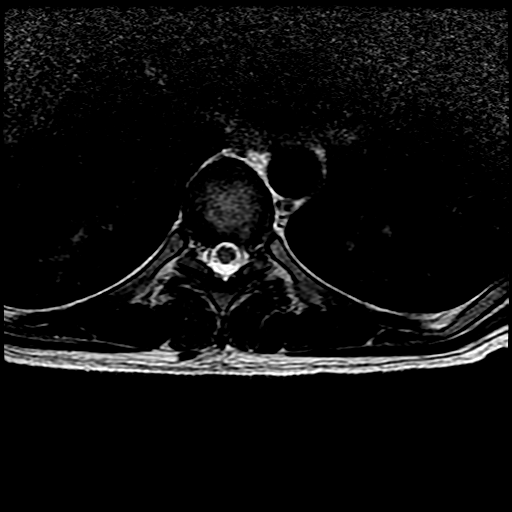
[im 21/42]
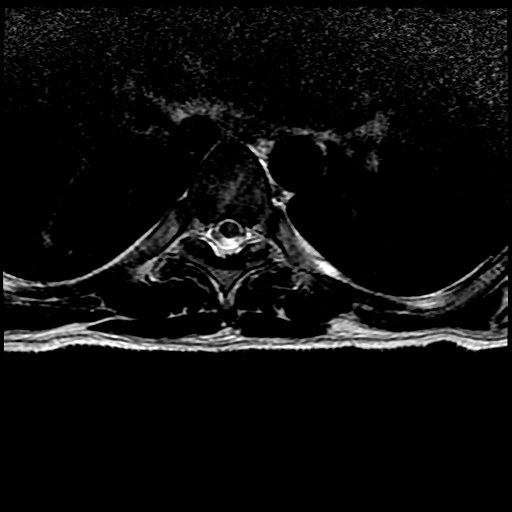
[im 28/42]
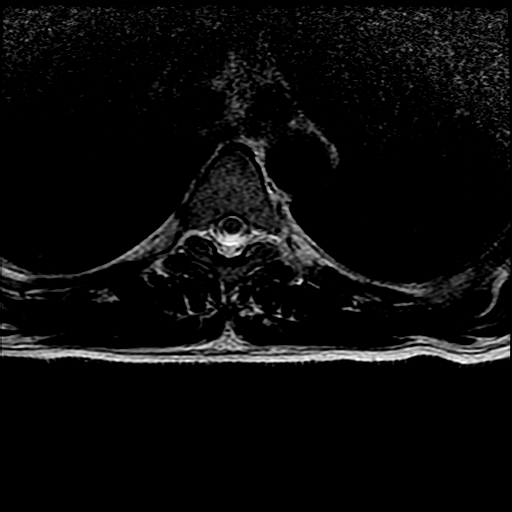
[im 35/42]
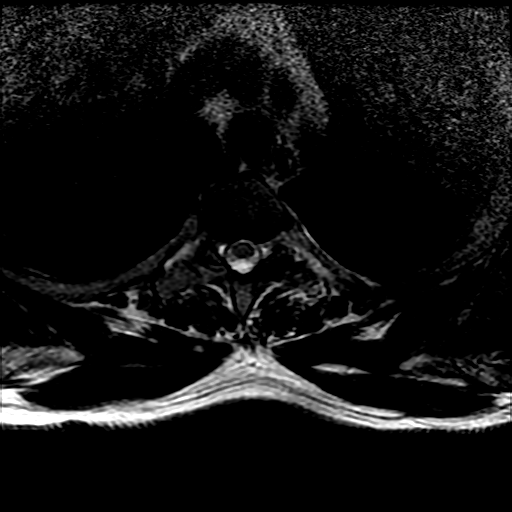
[im 42/42]
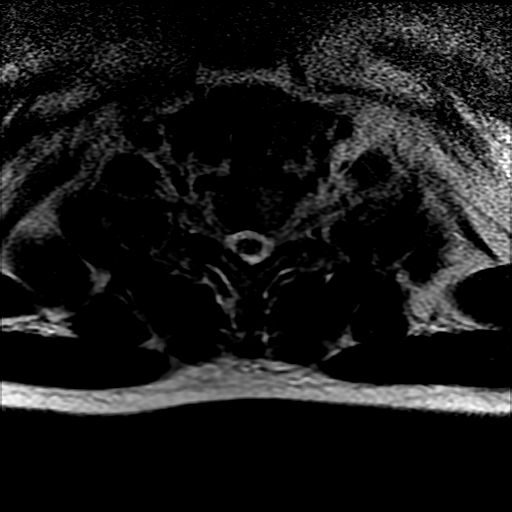

[Series 16: T2 post-contrast · sagittal · 4.0mm · 0.51mm/px · 2 of 14 slices shown (2 of 2)]
[im 1/14]
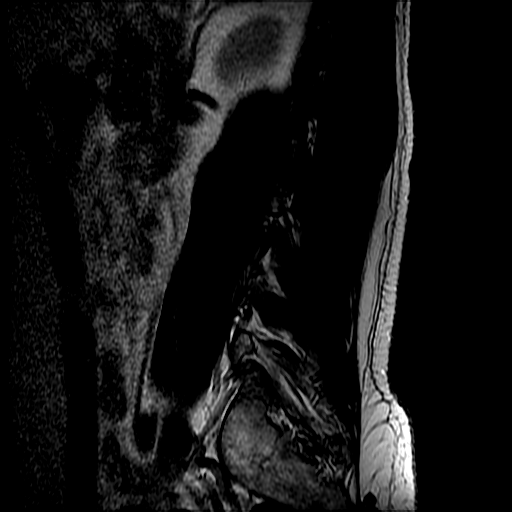
[im 14/14]
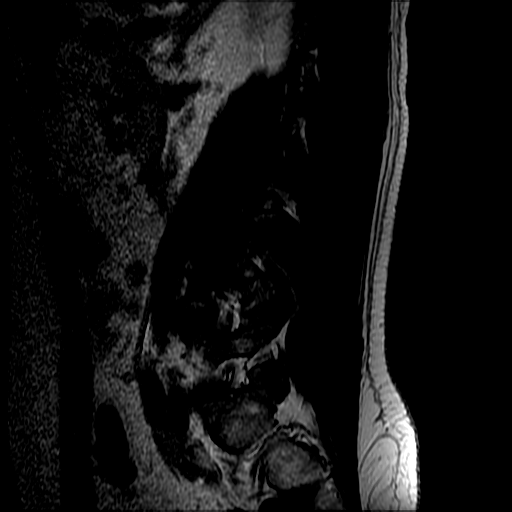

[Series 18: T2 · axial · 4.0mm · 0.41mm/px · z∈[-534,-328]mm · 6 of 36 slices shown (2 of 2)]
[im 1/36]
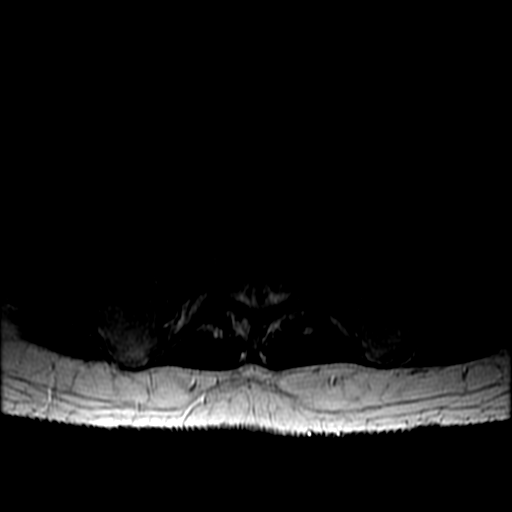
[im 8/36]
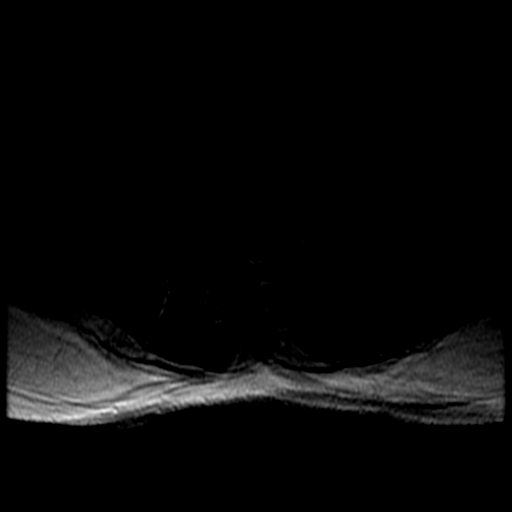
[im 15/36]
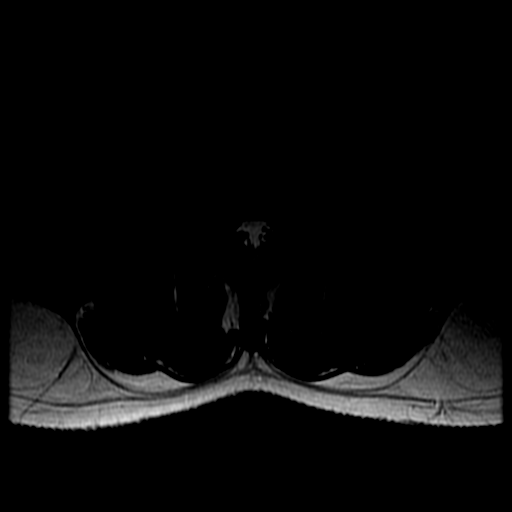
[im 22/36]
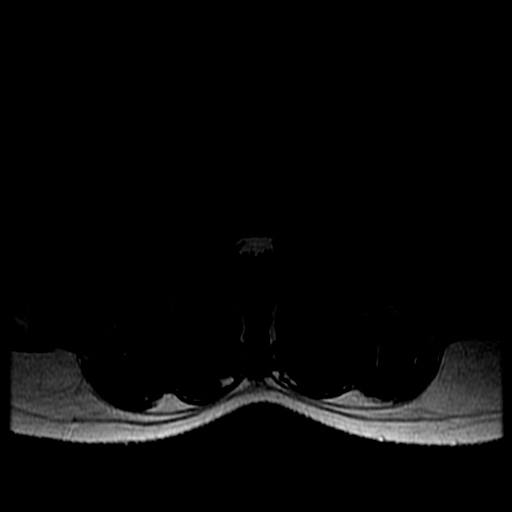
[im 29/36]
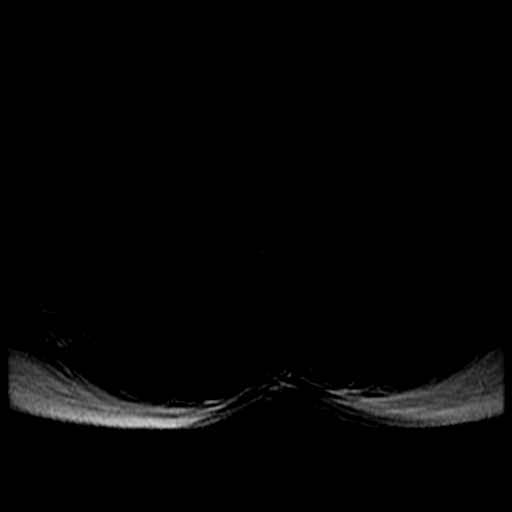
[im 36/36]
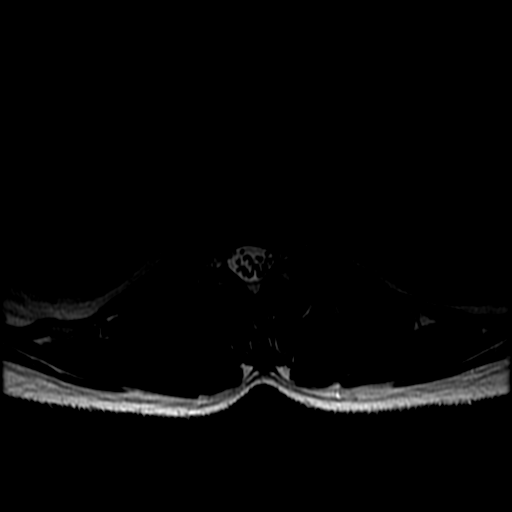

[19 of 48 positions shown; findings below may reference images not displayed]

FINDINGS: MRI THORACIC SPINE FINDINGS

Alignment:  Physiologic.

Vertebrae: Benign-appearing 11 mm lesion in the left side of the T6
vertebral body, most likely a benign hemangioma. Otherwise negative.

Cord:  Normal.

Paraspinal and other soft tissues: Normal. No paraspinal edema or
other abnormality.

Disc levels:

T2-3: There is a tiny bulge of the disc with no neural impingement.

T3-4: Disc space narrowing.  No disc bulging or protrusion.

The remainder of the discs in the thoracic spine appear normal.

MRI LUMBAR SPINE FINDINGS

Segmentation:  Standard.

Alignment:  Physiologic.

Vertebrae:  No fracture, evidence of discitis, or bone lesion.

Conus medullaris and cauda equina: Conus extends to the L1-2 level.
Conus and cauda equina appear normal.

Paraspinal and other soft tissues: Negative.

Disc levels:

L1-2: Slight disc desiccation. Tiny disc bulge to the left of
midline with no neural impingement.

L2-3: Normal.

L3-4: Normal.

L4-5: Normal.

L5-S1: Focal soft disc protrusion into the right lateral recess with
a slight mass effect upon the right S1 nerve. Otherwise negative.
IMPRESSION: MR THORACIC SPINE IMPRESSION

No significant abnormality of the thoracic spine. Specifically, no
evidence of spinal or paraspinal infection.

MR LUMBAR SPINE IMPRESSION

No evidence of spinal or paraspinal infection.

Focal soft disc protrusion at L5-S1 to the right with a mass effect
upon the right S1 nerve.

Otherwise, no significant abnormality.
